# Patient Record
Sex: Male | Born: 1950 | Race: White | Hispanic: No | Marital: Single | Smoking: Current some day smoker
Health system: Southern US, Community
[De-identification: ages and names within clinical notes are randomized; demographics above are authoritative.]

## PROBLEM LIST (undated history)

## (undated) DIAGNOSIS — J449 Chronic obstructive pulmonary disease, unspecified: Secondary | ICD-10-CM

## (undated) DIAGNOSIS — M47812 Spondylosis without myelopathy or radiculopathy, cervical region: Secondary | ICD-10-CM

## (undated) DIAGNOSIS — K2100 Gastro-esophageal reflux disease with esophagitis, without bleeding: Secondary | ICD-10-CM

## (undated) DIAGNOSIS — I10 Essential (primary) hypertension: Secondary | ICD-10-CM

## (undated) DIAGNOSIS — I350 Nonrheumatic aortic (valve) stenosis: Secondary | ICD-10-CM

## (undated) DIAGNOSIS — G894 Chronic pain syndrome: Secondary | ICD-10-CM

## (undated) DIAGNOSIS — F319 Bipolar disorder, unspecified: Secondary | ICD-10-CM

## (undated) DIAGNOSIS — M47816 Spondylosis without myelopathy or radiculopathy, lumbar region: Secondary | ICD-10-CM

## (undated) DIAGNOSIS — R569 Unspecified convulsions: Secondary | ICD-10-CM

## (undated) DIAGNOSIS — Q2381 Bicuspid aortic valve: Secondary | ICD-10-CM

## (undated) DIAGNOSIS — I4891 Unspecified atrial fibrillation: Secondary | ICD-10-CM

## (undated) DIAGNOSIS — K648 Other hemorrhoids: Secondary | ICD-10-CM

## (undated) DIAGNOSIS — J189 Pneumonia, unspecified organism: Secondary | ICD-10-CM

## (undated) DIAGNOSIS — Q231 Congenital insufficiency of aortic valve: Secondary | ICD-10-CM

## (undated) DIAGNOSIS — D649 Anemia, unspecified: Secondary | ICD-10-CM

## (undated) HISTORY — DX: Spondylosis without myelopathy or radiculopathy, cervical region: M47.812

## (undated) HISTORY — DX: Anemia, unspecified: D64.9

## (undated) HISTORY — DX: Gastro-esophageal reflux disease with esophagitis, without bleeding: K21.00

## (undated) HISTORY — PX: KNEE SURGERY: SHX244

## (undated) HISTORY — DX: Congenital insufficiency of aortic valve: Q23.1

## (undated) HISTORY — DX: Chronic obstructive pulmonary disease, unspecified: J44.9

## (undated) HISTORY — DX: Bipolar disorder, unspecified: F31.9

## (undated) HISTORY — DX: Unspecified convulsions: R56.9

## (undated) HISTORY — DX: Other hemorrhoids: K64.8

## (undated) HISTORY — DX: Unspecified atrial fibrillation: I48.91

## (undated) HISTORY — PX: CARPAL TUNNEL RELEASE: SHX101

## (undated) HISTORY — DX: Nonrheumatic aortic (valve) stenosis: I35.0

## (undated) HISTORY — DX: Bicuspid aortic valve: Q23.81

## (undated) HISTORY — DX: Chronic pain syndrome: G89.4

## (undated) HISTORY — DX: Gastro-esophageal reflux disease with esophagitis: K21.0

## (undated) HISTORY — DX: Essential (primary) hypertension: I10

## (undated) HISTORY — DX: Pneumonia, unspecified organism: J18.9

## (undated) HISTORY — DX: Spondylosis without myelopathy or radiculopathy, lumbar region: M47.816

## (undated) HISTORY — PX: NASAL SINUS SURGERY: SHX719

---

## 2004-12-22 ENCOUNTER — Emergency Department (HOSPITAL_COMMUNITY): Admission: EM | Admit: 2004-12-22 | Discharge: 2004-12-22 | Payer: Self-pay | Admitting: Emergency Medicine

## 2005-03-15 ENCOUNTER — Ambulatory Visit: Payer: Self-pay | Admitting: Psychiatry

## 2005-03-16 ENCOUNTER — Ambulatory Visit: Payer: Self-pay | Admitting: Psychiatry

## 2005-04-27 ENCOUNTER — Emergency Department: Payer: Self-pay | Admitting: Emergency Medicine

## 2005-06-27 ENCOUNTER — Emergency Department: Payer: Self-pay | Admitting: Emergency Medicine

## 2005-07-29 ENCOUNTER — Ambulatory Visit: Payer: Self-pay | Admitting: General Practice

## 2005-08-13 ENCOUNTER — Emergency Department: Payer: Self-pay | Admitting: General Practice

## 2005-10-14 ENCOUNTER — Ambulatory Visit: Payer: Self-pay | Admitting: Pain Medicine

## 2005-10-20 ENCOUNTER — Ambulatory Visit: Payer: Self-pay | Admitting: Pain Medicine

## 2005-12-23 ENCOUNTER — Ambulatory Visit: Payer: Self-pay | Admitting: Pain Medicine

## 2006-03-15 ENCOUNTER — Other Ambulatory Visit: Payer: Self-pay

## 2006-03-15 ENCOUNTER — Emergency Department: Payer: Self-pay | Admitting: Emergency Medicine

## 2006-09-18 ENCOUNTER — Emergency Department: Payer: Self-pay | Admitting: Emergency Medicine

## 2006-09-28 ENCOUNTER — Ambulatory Visit: Payer: Self-pay

## 2006-10-17 ENCOUNTER — Emergency Department: Payer: Self-pay | Admitting: Emergency Medicine

## 2006-10-20 ENCOUNTER — Inpatient Hospital Stay: Payer: Self-pay | Admitting: Internal Medicine

## 2006-10-20 ENCOUNTER — Other Ambulatory Visit: Payer: Self-pay

## 2006-12-22 ENCOUNTER — Emergency Department: Payer: Self-pay | Admitting: Emergency Medicine

## 2007-01-29 ENCOUNTER — Inpatient Hospital Stay: Payer: Self-pay | Admitting: Unknown Physician Specialty

## 2007-02-01 ENCOUNTER — Other Ambulatory Visit: Payer: Self-pay

## 2007-02-15 ENCOUNTER — Other Ambulatory Visit: Payer: Self-pay

## 2007-02-15 ENCOUNTER — Emergency Department: Payer: Self-pay | Admitting: Emergency Medicine

## 2007-03-23 ENCOUNTER — Inpatient Hospital Stay: Payer: Self-pay | Admitting: Unknown Physician Specialty

## 2007-06-05 ENCOUNTER — Ambulatory Visit: Payer: Self-pay | Admitting: Neurological Surgery

## 2007-08-11 ENCOUNTER — Other Ambulatory Visit: Payer: Self-pay

## 2007-08-11 ENCOUNTER — Inpatient Hospital Stay: Payer: Self-pay | Admitting: Psychiatry

## 2007-08-24 ENCOUNTER — Encounter: Payer: Self-pay | Admitting: Neurological Surgery

## 2007-09-06 ENCOUNTER — Encounter: Payer: Self-pay | Admitting: Neurological Surgery

## 2008-01-12 ENCOUNTER — Ambulatory Visit: Payer: Self-pay | Admitting: Cardiology

## 2008-01-19 ENCOUNTER — Encounter
Admission: RE | Admit: 2008-01-19 | Discharge: 2008-04-18 | Payer: Self-pay | Admitting: Physical Medicine & Rehabilitation

## 2008-01-22 ENCOUNTER — Ambulatory Visit: Payer: Self-pay | Admitting: Physical Medicine & Rehabilitation

## 2008-01-23 ENCOUNTER — Ambulatory Visit: Payer: Self-pay | Admitting: Cardiology

## 2008-01-24 ENCOUNTER — Ambulatory Visit: Payer: Self-pay

## 2008-01-24 ENCOUNTER — Encounter: Payer: Self-pay | Admitting: Cardiology

## 2008-01-26 ENCOUNTER — Ambulatory Visit: Payer: Self-pay | Admitting: Cardiology

## 2008-01-30 ENCOUNTER — Ambulatory Visit: Payer: Self-pay | Admitting: Cardiology

## 2008-02-07 ENCOUNTER — Ambulatory Visit: Payer: Self-pay

## 2008-02-09 ENCOUNTER — Ambulatory Visit: Payer: Self-pay | Admitting: Cardiology

## 2008-02-10 ENCOUNTER — Encounter: Payer: Self-pay | Admitting: Cardiology

## 2008-02-10 LAB — CONVERTED CEMR LAB
BUN: 12 mg/dL (ref 6–23)
CO2: 25 meq/L (ref 19–32)
Calcium: 9.9 mg/dL (ref 8.4–10.5)
Chloride: 98 meq/L (ref 96–112)
Creatinine, Ser: 0.79 mg/dL (ref 0.40–1.50)
Glucose, Bld: 84 mg/dL (ref 70–99)
HCT: 41.8 % (ref 39.0–52.0)
Hemoglobin: 13.8 g/dL (ref 13.0–17.0)
INR: 1 (ref 0.0–1.5)
MCHC: 33 g/dL (ref 30.0–36.0)
MCV: 89.5 fL (ref 78.0–100.0)
Platelets: 250 10*3/uL (ref 150–400)
Potassium: 4.5 meq/L (ref 3.5–5.3)
Prothrombin Time: 13.2 s (ref 11.6–15.2)
RBC: 4.67 M/uL (ref 4.22–5.81)
RDW: 14.4 % (ref 11.5–15.5)
Sodium: 138 meq/L (ref 135–145)
WBC: 7.8 10*3/uL (ref 4.0–10.5)

## 2008-02-12 ENCOUNTER — Ambulatory Visit: Payer: Self-pay | Admitting: Physical Medicine & Rehabilitation

## 2008-02-14 ENCOUNTER — Ambulatory Visit: Payer: Self-pay | Admitting: Cardiology

## 2008-02-14 DIAGNOSIS — J449 Chronic obstructive pulmonary disease, unspecified: Secondary | ICD-10-CM

## 2008-02-14 DIAGNOSIS — F319 Bipolar disorder, unspecified: Secondary | ICD-10-CM

## 2008-02-14 DIAGNOSIS — E785 Hyperlipidemia, unspecified: Secondary | ICD-10-CM

## 2008-02-14 DIAGNOSIS — J4489 Other specified chronic obstructive pulmonary disease: Secondary | ICD-10-CM | POA: Insufficient documentation

## 2008-02-14 DIAGNOSIS — I6529 Occlusion and stenosis of unspecified carotid artery: Secondary | ICD-10-CM | POA: Insufficient documentation

## 2008-02-14 DIAGNOSIS — I10 Essential (primary) hypertension: Secondary | ICD-10-CM | POA: Insufficient documentation

## 2008-02-14 DIAGNOSIS — I359 Nonrheumatic aortic valve disorder, unspecified: Secondary | ICD-10-CM | POA: Insufficient documentation

## 2008-02-19 ENCOUNTER — Ambulatory Visit: Payer: Self-pay | Admitting: Cardiology

## 2008-03-12 ENCOUNTER — Ambulatory Visit: Payer: Self-pay | Admitting: Physical Medicine & Rehabilitation

## 2008-04-11 ENCOUNTER — Ambulatory Visit: Payer: Self-pay | Admitting: Physical Medicine & Rehabilitation

## 2008-05-08 ENCOUNTER — Encounter
Admission: RE | Admit: 2008-05-08 | Discharge: 2008-07-11 | Payer: Self-pay | Admitting: Physical Medicine & Rehabilitation

## 2008-05-09 ENCOUNTER — Ambulatory Visit: Payer: Self-pay | Admitting: Physical Medicine & Rehabilitation

## 2008-05-13 ENCOUNTER — Encounter: Admission: RE | Admit: 2008-05-13 | Discharge: 2008-05-14 | Payer: Self-pay | Admitting: Anesthesiology

## 2008-05-14 ENCOUNTER — Ambulatory Visit: Payer: Self-pay | Admitting: Anesthesiology

## 2008-06-13 ENCOUNTER — Ambulatory Visit: Payer: Self-pay | Admitting: Physical Medicine & Rehabilitation

## 2008-06-18 ENCOUNTER — Emergency Department: Payer: Self-pay | Admitting: Emergency Medicine

## 2008-07-08 ENCOUNTER — Ambulatory Visit: Payer: Self-pay | Admitting: Physical Medicine & Rehabilitation

## 2008-07-11 ENCOUNTER — Ambulatory Visit: Payer: Self-pay | Admitting: Physical Medicine & Rehabilitation

## 2008-08-07 ENCOUNTER — Encounter
Admission: RE | Admit: 2008-08-07 | Discharge: 2008-08-13 | Payer: Self-pay | Admitting: Physical Medicine & Rehabilitation

## 2008-08-13 ENCOUNTER — Ambulatory Visit: Payer: Self-pay | Admitting: Physical Medicine & Rehabilitation

## 2008-08-14 ENCOUNTER — Encounter: Payer: Self-pay | Admitting: Cardiology

## 2008-08-14 ENCOUNTER — Ambulatory Visit: Payer: Self-pay | Admitting: Cardiology

## 2008-09-03 ENCOUNTER — Encounter: Payer: Self-pay | Admitting: Cardiology

## 2008-09-03 ENCOUNTER — Ambulatory Visit: Payer: Self-pay

## 2008-09-03 ENCOUNTER — Ambulatory Visit: Payer: Self-pay | Admitting: Cardiology

## 2008-09-06 LAB — CONVERTED CEMR LAB
ALT: 29 units/L (ref 0–53)
AST: 34 units/L (ref 0–37)
Albumin: 4.1 g/dL (ref 3.5–5.2)
Alkaline Phosphatase: 44 units/L (ref 39–117)
Bilirubin, Direct: 0 mg/dL (ref 0.0–0.3)
Cholesterol: 136 mg/dL (ref 0–200)
HDL: 36.6 mg/dL — ABNORMAL LOW (ref >39.00)
LDL Cholesterol: 83 mg/dL (ref 0–99)
Total Bilirubin: 0.9 mg/dL (ref 0.3–1.2)
Total CHOL/HDL Ratio: 4
Total Protein: 7.9 g/dL (ref 6.0–8.3)
Triglycerides: 80 mg/dL (ref 0.0–149.0)
VLDL: 16 mg/dL (ref 0.0–40.0)

## 2008-10-14 ENCOUNTER — Ambulatory Visit (HOSPITAL_COMMUNITY): Admission: RE | Admit: 2008-10-14 | Discharge: 2008-10-14 | Payer: Self-pay | Admitting: Orthopedic Surgery

## 2009-02-07 ENCOUNTER — Ambulatory Visit: Payer: Self-pay | Admitting: Cardiology

## 2009-02-10 ENCOUNTER — Telehealth: Payer: Self-pay | Admitting: Cardiology

## 2009-02-14 ENCOUNTER — Ambulatory Visit: Payer: Self-pay | Admitting: Cardiology

## 2009-02-17 LAB — CONVERTED CEMR LAB
ALT: 31 units/L (ref 0–53)
AST: 25 units/L (ref 0–37)
Albumin: 4.6 g/dL (ref 3.5–5.2)
Alkaline Phosphatase: 47 units/L (ref 39–117)
BUN: 12 mg/dL (ref 6–23)
CO2: 28 meq/L (ref 19–32)
Calcium: 9.5 mg/dL (ref 8.4–10.5)
Chloride: 100 meq/L (ref 96–112)
Cholesterol: 125 mg/dL (ref 0–200)
Creatinine, Ser: 0.94 mg/dL (ref 0.40–1.50)
Glucose, Bld: 87 mg/dL (ref 70–99)
HDL: 35 mg/dL — ABNORMAL LOW (ref >39)
LDL Cholesterol: 69 mg/dL (ref 0–99)
Potassium: 5.1 meq/L (ref 3.5–5.3)
Sodium: 139 meq/L (ref 135–145)
Total Bilirubin: 0.3 mg/dL (ref 0.3–1.2)
Total CHOL/HDL Ratio: 3.6
Total Protein: 7.3 g/dL (ref 6.0–8.3)
Triglycerides: 107 mg/dL (ref <150)
VLDL: 21 mg/dL (ref 0–40)

## 2009-02-18 ENCOUNTER — Encounter: Payer: Self-pay | Admitting: Cardiology

## 2009-02-18 ENCOUNTER — Ambulatory Visit: Payer: Self-pay | Admitting: Cardiology

## 2009-04-22 ENCOUNTER — Ambulatory Visit: Payer: Self-pay | Admitting: Cardiovascular Disease

## 2009-04-22 ENCOUNTER — Inpatient Hospital Stay: Payer: Self-pay | Admitting: Internal Medicine

## 2009-05-09 ENCOUNTER — Ambulatory Visit: Payer: Self-pay | Admitting: Cardiovascular Disease

## 2009-05-09 ENCOUNTER — Observation Stay: Payer: Self-pay | Admitting: Internal Medicine

## 2009-06-18 ENCOUNTER — Ambulatory Visit: Payer: Self-pay | Admitting: Surgery

## 2009-07-01 ENCOUNTER — Emergency Department: Payer: Self-pay | Admitting: Emergency Medicine

## 2010-01-03 ENCOUNTER — Emergency Department: Payer: Self-pay | Admitting: Unknown Physician Specialty

## 2010-01-12 ENCOUNTER — Emergency Department: Payer: Self-pay | Admitting: Emergency Medicine

## 2010-01-26 ENCOUNTER — Emergency Department: Payer: Self-pay | Admitting: Emergency Medicine

## 2010-02-28 ENCOUNTER — Inpatient Hospital Stay: Payer: Self-pay | Admitting: Internal Medicine

## 2010-03-04 ENCOUNTER — Ambulatory Visit (HOSPITAL_COMMUNITY): Admission: RE | Admit: 2010-03-04 | Discharge: 2010-03-04 | Payer: Self-pay | Source: Home / Self Care

## 2010-03-29 ENCOUNTER — Encounter: Payer: Self-pay | Admitting: Cardiology

## 2010-04-29 ENCOUNTER — Ambulatory Visit: Payer: Self-pay | Admitting: Emergency Medicine

## 2010-04-30 ENCOUNTER — Emergency Department: Payer: Self-pay | Admitting: Emergency Medicine

## 2010-05-12 ENCOUNTER — Ambulatory Visit: Payer: Self-pay | Admitting: Emergency Medicine

## 2010-06-13 LAB — CBC
HCT: 43.6 % (ref 39.0–52.0)
Hemoglobin: 14.9 g/dL (ref 13.0–17.0)
MCHC: 34.2 g/dL (ref 30.0–36.0)
MCV: 88.8 fL (ref 78.0–100.0)
Platelets: 234 10*3/uL (ref 150–400)
RBC: 4.91 MIL/uL (ref 4.22–5.81)
RDW: 14.9 % (ref 11.5–15.5)
WBC: 6.9 10*3/uL (ref 4.0–10.5)

## 2010-06-13 LAB — BASIC METABOLIC PANEL
BUN: 13 mg/dL (ref 6–23)
CO2: 29 mEq/L (ref 19–32)
Calcium: 9.2 mg/dL (ref 8.4–10.5)
Chloride: 102 mEq/L (ref 96–112)
Creatinine, Ser: 0.82 mg/dL (ref 0.4–1.5)
GFR calc Af Amer: >60 mL/min (ref >60)
GFR calc non Af Amer: >60 mL/min (ref >60)
Glucose, Bld: 103 mg/dL — ABNORMAL HIGH (ref 70–99)
Potassium: 5 mEq/L (ref 3.5–5.1)
Sodium: 136 mEq/L (ref 135–145)

## 2010-07-21 NOTE — Assessment & Plan Note (Signed)
Wayne Guzman returns today for followup of cervicalgia.  He is having  problems sleeping at night due to pain.  He has good pain relief  throughout the day or at least fair with oxycodone 5 mg t.i.d.  He has  responded to a set of medial branch blocks C5-6 level, and is scheduled  for repeat injections with Dr. Stevphen Rochester.  His main complaint today other  than his neck is his right hand pain that goes up into the forearm.  He  has had previous relief with carpal tunnel injections.  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   Examination, he has good grip.  He has no signs of thenar atrophy.  He  has had negative Tinel's.  Phalen's is limited by elbow pain.  He has  limited wrist range of motion on the right side.   IMPRESSION:  1. Cervicalgia, positive response to cervical medial branch block.      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  We will      go ahead and keep him on schedule for May 14, 2008.  2. Right carpal tunnel syndrome.  We will repeat a carpal tunnel      injection.  If his injections only last a short period of time i.e.      less than 3 months, we will consider repeat EMG and surgical      referral.      Erick Colace, M.D.  Electronically Signed     AEK/MedQ  D:  05/09/2008 10:05:43  T:  05/09/2008 20:53:53  Job #:  [id number]

## 2010-07-21 NOTE — Procedures (Signed)
Wayne Guzman, BROUGHTON              ACCOUNT NO.:  [id number]   MEDICAL RECORD NO.:  [id number]          PATIENT TYPE:  REC   LOCATION:  [facility]                          FACILITY:  [facility]   PHYSICIAN:  Erick Colace, M.D.DATE OF BIRTH:  1950/03/17   DATE OF PROCEDURE:  DATE OF DISCHARGE:                               OPERATIVE REPORT   PROCEDURE:  L5-S1 translaminar lumbar epidural steroid injection, right  paramedian approach.   INDICATIONS:  Lumbar radiculitis with moderate-to-severe bilateral  foraminal narrowing L5-S1, bilateral lower extremity radicular pain with  pain interfering with self-care mobility persisting despite medication  management including narcotic analgesic medications.   Informed consent was obtained after describing risks and benefits of the  procedure with the patient.  These include bleeding, bruising,  infection, temporary permanent paralysis.  He elects to proceed and has  given written consent.  The patient placed prone on a fluoroscopy table.  Betadine prep, sterile drape, 25-gauge inch and half needle was used to  anesthetize the skin and subcutaneous tissue, 1% lidocaine x2 mL.  An 18-  gauge, 8-cm Tuohy needle was inserted under fluoroscopic guidance  targeting the inferior lamina of L5 on the right side near midline just  lateral to the spinous process.  Bone contact made, needle was  redirected inferiorly, loss of resistance technique utilized, loss of  resistance obtained while imaging under lateral fluoroscopic views.  Then under AP, Omnipaque 180 x1.5 mL demonstrate good epidural spread  followed by injection of 2 mL of 40 mg/mL Depo-Medrol and 2 mL of 1%  lidocaine.  The patient tolerated the procedure well.  Pre and post  injection vitals stable.  Post injection instructions given.      Erick Colace, M.D.  Electronically Signed     AEK/MEDQ  D:  07/11/2008 14:28:53  T:  07/12/2008 05:03:35  Job:  [id number]

## 2010-07-21 NOTE — Consult Note (Signed)
REQUESTING PHYSICIAN:  Galen Daft. Guffey, MD.   REASON FOR CONSULTATION:  Evaluation of neck pain and back pain.   CHIEF COMPLAINT:  Neck pain greater than back pain greater than right  hand numbness and tingling pain.   HISTORY:  A 60 year old male who has had onset of neck and back pain  dating to at least 2006.  No significant trauma.  He has not been  employed since 2003.  He has comorbidities including bipolar disorder,  aortic stenosis, and COPD.   He has been followed by Dr. Metta Clines at the [facility].  More  recently, he was evaluated by Dr. Murray Hodgkins at [facility].  He saw Dr. Marikay Alar from [facility] and no surgical  intervention was planned.  While in the care of Dr. 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  He had some type of injection, which  according to the notes is likely to be cervical epidural.  I do not have  the actual injection, but this has been referred to.   The patient reportedly has epidural injections per Dr. Metta Clines over at  [facility] as well.   It is not clear why the patient no longer follows at [facility].   Current pain is 8/10, this refers to his neck pain.  Daytime is the  worst.  Pain interferes with sleep.  Pain is worse with walking,  bending, and standing, but he does not have any limitation in his  walking tolerance.  He can exercise on an exercise bike that he brought  too long ago.  He did have some physical therapy in the past and  continues to do some of his exercises at home.  He did buy a TENS unit  and this is partially helpful.  He has never tried traction.   OTHER MEDICATIONS:  Depakote, fentanyl, oxycodone in the past and more  currently he has been on hydrocodone prescribed by his primary care  physician 5 mg one half p.o. q.i.d.   REVIEW OF SYSTEMS:  Negative for bowel or bladder  dysfunction.  He has  no significant radiating pain down his legs.Marland Kitchen   FAMILY HISTORY:  Significant for hypertension, heart disease, and  arthritis.   SOCIAL HISTORY:  He smokes a pack a day.  No alcohol.  Denies any  illicit drugs other than from college.  He had a DUI 10 years ago, but  really has not used alcohol since that time per his report.   PHYSICAL EXAMINATION:  VITAL SIGNS:  Blood pressure 140/78, pulse 70,  respirations 18, and O2 sat 98% on room air.  GENERAL:  Well-developed, well-nourished male, in no acute distress.  Orientation x3.  Affect alert.  Gait is normal.  He is able to heel walk  and toe walk.  EXTREMITIES:  Without edema.  Coordination is normal in the upper and  lower extremities.  Deep tendon reflexes are normal in the upper and  lower extremities.  Sensation is normal in the upper and lower  extremities.  Reverse Phalen's does produce some paresthesias in right  upper extremity.  Motor strength 5/5 in bilateral deltoid, biceps,  triceps, grip as well as hip flexion, knee extension, and ankle  dorsiflexion.  He has normal upper and lower extremity range of motion.  His cervical range of motion is limited to 50% range in forward flexion,  extension,  lateral rotation, and bending.  Lumbar range of motion is  full in forward flexion, but with extension limited to 50% of normal.   Review of radiologic images, was able to pull up C-spine x-ray dated  December 22, 2004, which showed severe spondylosis multilevel  particularly C4-5 and C5-6 levels.  He did have some foraminal stenosis  as well.  No signs of cervical radiculitis.   IMPRESSION:  1. Chronic low back pain, question whether this is facet mediated as      well, although this is a secondary complaint.  2. Right carpal tunnel syndrome.   PLAN:  1. Discussed treatment options.  We went over spine model.  He does      have mainly axial neck pain.  I suspect his degenerative disk in      combination  with facet-mediated pain are likely pain generators.      We will set him up for a medial branch block.  2. In terms of narcotic analgesics, we will need to first check urine      drug screen.  He does seem to recently get by with hydrocodone.  We      will be able to keep him on that rather than getting him back on a      long acting again given that a lot of his pain is activity mediated      and more intermittent.  3. Consider carpal tunnel injection.  4. I will start him on tramadol for now.  He will be able to get by      with this.  5. I have written a prescription for cervical traction.   His Oswestry disability index score today is 68%.   Thank you for this interesting consultation and keep you apprised of his  progress.      Erick Colace, M.D.  Electronically Signed     AEK/MedQ  D:01/22/2008 13:47:57  T:01/23/2008 03:56:51  Job #:  [id number]   cc:   Callie Fielding, M.D.  Fax: [phone]

## 2010-07-21 NOTE — Assessment & Plan Note (Signed)
Mr. Enderle has undergone cervical facet medial branch block, C4-5, C6-7  per Dr. Stevphen Rochester bilaterally.  This gave him a good 2-week relief.  This  is the second time, he has had similar degree of relief from injection  at the same level.  He also complains of back pain and this in fact is  his major complaint at the current time.  His neck is still hanging in  there with the last injection.   Continue using pain medications and has had a flare-up of his back pain,  wondering if he could go up a bit on his oxycodone just for a month or  so.   I have reviewed his prior MRI reports.  He does have evidence of  degenerative disk, particularly at L5-S1 and moderate-to-severe  bilateral foraminal narrowing.   Current pain meds oxycodone 5 mg q.i.d.   Current pain level 7/10, back greater than neck.  He walks 2 minutes of  time and does not climb steps.   REVIEW OF SYSTEMS:  Positive for depression and anxiety.  He sees Dr.  Stevphen Rochester Su who is his psychiatrist.   PAST MEDICAL HISTORY:  Hypertension.   SOCIAL HISTORY:  Single, had a DUI 10 years ago.  Denies any current  alcohol use.   FAMILY HISTORY:  Heart disease, diabetes, and high blood pressure.   MEDICATIONS:  Effexor, Zoloft, clonazepam, Depakote, Zyprexa, and Zocor.   PHYSICAL EXAMINATION:  VITAL SIGNS:  Blood pressure 106/74, pulse 88,  respirations 18, O2 sat 97% on room air.  GENERAL:  Well-nourished and well-nourished male in no acute distress.  NECK:  Range of motion is 50%, forward flexion, extension, lateral  rotation, and bending.  His pain is mainly with sideward bending pain  over the lateral masses.  Motor strength is full bilateral upper and  lower extremities.  He does have some pain going down into his left  lower extremity, exacerbated by straight leg raising test.   Oswestry score is 50% today.   IMPRESSION:  Cervicalgia with improvement x2 with cervical medial branch  blocks.  He will benefit from cervical  facet radiofrequency neurotomy;  however, we would like to first address his low back as this is  bothering him more than his neck at the current time.  I reviewed his  scans and reviewed his exam and history and we will schedule for L5-S1  translaminar lumbar epidural steroid injection and failing this,  consider other treatment plans such as SI injection versus medial branch  blocks.   PLAN:  To do a L5-S1 injection translaminar.      Erick Colace, M.D.  Electronically Signed    AEK/MedQ  D:  06/13/2008 15:21:06  T:  06/14/2008 01:45:44  Job #:  [id number]

## 2010-07-21 NOTE — Assessment & Plan Note (Signed)
Wayne Guzman OFFICE NOTE   NAME:Wayne Guzman, Wayne Guzman                       MRN:          [id number]  DATE:01/12/2008                            DOB:          [birth year]    PRIMARY CARE PHYSICIAN:  Dr. Gelene Mink.   HISTORY OF PRESENT ILLNESS:  This is a 60 year old with a history of  bipolar disorder, COPD, active smoking, and hypertension, who presents  for a second opinion on aortic stenosis.  The patient has had an  echocardiogram done in December 2008 showing what sounds like mild  aortic stenosis with an aortic valve area of 1.71 cm.  He was re-echoed  about 6 months later at another facility.  We do not have the result  from that echo yet, though he states that the doctor told him that he  would need his valve replaced within 5 years.  At baseline, the patient  smokes about half pack a day.  He has noted over the past year that he  has had increasing shortness of breath on exertion.  He states that at  this point, he is short of breath after climbing 1 flight of steps or  walking 2-3 blocks on flat ground.  Additionally, he does have episodes  that he describes as panic attacks, and these happen 2-3 times a month.  There is no particular trigger.  They are associated with extreme  feelings of anxiety along with central chest tightness.  These last  anywhere from 10 minutes to an hour and they are not associated with  exertion in particular.  He has been to the hospital 3 times with these  episodes.  Each time, he has been ruled out for an MI with negative  cardiac enzymes.  He states that since he has been on a better regimen  of psychiatric medications for his bipolar disorder, these panic attacks  have been much less frequent.  Additionally, the patient did have a  stress test that he thinks occurred about 6 months ago at Dr. Jari Sportsman  office.  He was not told the results and we do not have the results  available today.   PAST MEDICAL HISTORY:  1. Bipolar disorder.  2. COPD.  3. The patient is an active smoker about a half pack a day.  He has      been smoking for 25-30 years.  Dr. Timoteo Gaul would like to avoid use      of Chantix on this patient because of risk of depression from that      medication.  4. Hypertension.  5. Chronic pain disorder.  The patient has a history of cervical and      lumbar pain from degenerative joint disease.  He is disabled and      [facility]  6. Aortic stenosis.  Most recent echo report that is available to me      is from December 2008, it shows by report normal LV systolic      function and mild aortic stenosis with an aortic valve area of  1.71      sq cm.  The study is described as technically limited.  7. Carotid ultrasound done in December 2008 showed left internal      carotid artery stenosis, 50-69%.   Most recent labs are from August 2009 that show a hematocrit of 41.7,  platelets 249; potassium 4.7, creatinine 0.96; LDL 95, HDL 39, and  triglycerides 54; TSH is normal.  EKG today, normal sinus rhythm.  It is  a normal EKG with no LVH.   MEDICATIONS:  1. Depakote 500 mg t.i.d.  2. Advair 250/50 daily.  3. Amlodipine/benazepril, uncertain of the dose.  The patient did not      bring his pill bottle on today.  4. Hydrocodone.  5. Zoloft 50 mg daily.  6. Zyprexa 10 mg nightly.  7. Klonopin.   SOCIAL HISTORY:  The patient is disabled due to degenerative joint  disease of the spine.  He smokes half pack a day.  He smoked more  heavily in the past.  He is interested in quitting.  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  He does  not use any illicit drugs.   FAMILY HISTORY:  The patient's father developed coronary artery disease  in his early 50s.  No other history of premature coronary artery disease  in the family.  No history that he knows of aortic stenosis.   REVIEW OF SYSTEMS:   Negative except as noted in the history of present  illness.   PHYSICAL EXAMINATION:  VITAL SIGNS:  Blood pressure 106/66, heart rate  is 84 and regular.  Weight is 168 pounds.  GENERAL:  This is a well-developed male in no apparent distress.  NEUROLOGIC:  Alert and oriented x3.  Normal affect.  HEENT:  Normal.  NECK:  No JVD.  There are normal carotid upstrokes.  No thyromegaly or  thyroid nodule.  LUNGS:  Slight rhonchi bilaterally.  Otherwise clear.  CARDIOVASCULAR:  Heart regular.  S1 and S2.  No S3.  No S4.  There is a  2/6 crescendo-decrescendo, mid-peaking systolic murmur heard best at the  right upper sternal border.  The murmur radiates into the neck, although  the murmur in the neck is more prominent on the left, which suggests  that there maybe a component of carotid stenosis from that.  S2 is heard  clearly.  Carotid upstrokes are normal.  There are 2+ posterior tibial  pulses bilaterally.  There is no peripheral edema.  ABDOMEN:  Soft and nontender.  No hepatosplenomegaly.  EXTREMITIES:  No clubbing or cyanosis.  SKIN:  Normal.  MUSCULOSKELETAL:  Normal.   ASSESSMENT AND PLAN:  This is a 60 year old with a history of chronic  obstructive pulmonary disease and bipolar disorder, who presents for  evaluation of aortic stenosis.  1. Aortic stenosis.  The patient does have significant New York Heart      Association Class III-type symptoms.  However, I am not sure if      these symptoms are due primarily to his COPD or if there is a      component of symptomatology from the heart.  On exam, the patient's      aortic stenosis to me seems at most moderate.  I do not hear exam      evidence for severe aortic stenosis.  In addition, the patient does      not appear volume overloaded on exam, and he does not  have left      ventricular hypertrophy on his electrocardiogram.  Our plan will be      to repeat an echocardiogram here.  I will take a close look at it      to see the  degree of his aortic stenosis, though I predict that it      will probably be mild-to-moderate.  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  Hopefully,      we will be able to see the bicuspid valve on transthoracic      echocardiogram.  If we are not able to see the valve clearly on      transthoracic echocardiogram, I probably would recommend that he      get a TEE done.  We will look at the TEE to see his valve of      bicuspid and if the valve is bicuspid, we will also need to check      and see if his thoracic aorta is dilated as this is a risk in the      setting of bicuspid aortic valve.  If we are able to see a bicuspid      aortic valve on the transthoracic echocardiogram, then I will      probably obtain a CTA or MRA of his chest to assess for aortic      dilatation, as this is a significant risk in the setting of      bicuspid valve.  2. Coronary artery disease risk.  The patient does have episodes of      atypical chest pain that he describes as panic attacks.  These may      in deed be due to his psychiatric disease.  However, I do think      given his risk factors that he needs to have had an evaluation for      coronary artery disease.  It appears he had a stress test about 6      months ago at Dr. Jari Sportsman office.  We will try to get a report of      that stress test from either Dr. Timoteo Gaul or Dr. Kirke Corin.  We will have      him start aspirin 81 mg a day.  His LDL is below 100.  I do not      really have any evidence for starting a statin drug, though there      is some suggestion that a low LDL can help prevent the progression      of aortic stenosis.  We should aim to keep his LDL below 100.  3. Hypertension.  The patient's blood pressure is well controlled on      his new medication, which is amlodipine/benazepril.  4. Chronic obstructive pulmonary disease.  The  patient does carry a      history of chronic obstructive pulmonary disease.  He does have      significant dyspnea on exertion.  I do wonder if his symptoms are      more due to his chronic obstructive pulmonary disease than to his      heart disease.  Therefore, I am going to get a set of PFTs checked      on him to see how severe his chronic obstructive pulmonary disease  is.  He will continue using his inhalers.  5. Carotid artery stenosis.  The patient does have a symptomatic 50-      69% carotid artery stenosis found on ultrasound in December 2008.      We will go ahead and repeat carotid ultrasound in December 2009,      and we can set that up in our office.  6. I talked to the patient about smoking cessation and gave him a      prescription for nicotine patches.  7. I will see the patient back in 2-3 weeks after we done the PFTs and      the echo for further evaluation.     [name], MD  Electronically Signed    DM/MedQ  DD: 01/12/2008  DT: 01/12/2008  Job #: [id number]   cc:   Dr. Gelene Mink

## 2010-07-21 NOTE — Assessment & Plan Note (Signed)
REASON FOR VISIT:  Neck pain greater than back pain.   HISTORY:  A 60 year old male who I last saw on March 12, 2008.  He  underwent his carpal tunnel injection for right hand pain and this was  after a positive EMG showing a carpal tunnel.  He had good results with  this.  He also had cervical injections done by myself on February 12, 2008, which resulted improvement in range of motion as well as pain,  although the pain relief was not immediate.  The patient did not have  any other new medical problems since I last saw him.  He was to get a  repeat set of medial branch blocks, however, his ride was late and  therefore was late for appointment, we just saw him for recheck today.   He also has back pain but this is not as bad as his neck pain and is  considering injections for that.   Examination of his neck has 75% range forward flexion, extension,  lateral rotation, and bending.  Extension seems to make his pain worse.  He has mild tenderness to palpation in the lateral aspect of the  cervical spine area.  He has full strength in the upper extremities,  normal range of motion in the upper extremities, as well as deep tendon  reflexes.   Pain level is 6/10.   Pain interference score is 5/10.  He is independent with the all self-  care.   Review of systems is positive for tingling, spasms, depression, and  anxiety.   Examination, blood pressure 128/66, pulse 85, respirations 18, O2 sat  97% on room air.  In general, no acute stress.   IMPRESSION:  Cervicalgia with positive response to one set of cervical  medial branch blocks.  Based on his response, we would recommend repeat.  His last injection was at C5-C6 level.  We will get him in first  available with Dr. Stevphen Rochester for next set to expedite.   I will see the patient back in about 1 month for a recheck.  Consider  further imaging for lumbar spine.      Erick Colace, M.D.  Electronically Signed     AEK/MedQ  D:  04/11/2008 11:25:23  T:  04/12/2008 00:19:02  Job #:  [id number]

## 2010-07-21 NOTE — Assessment & Plan Note (Signed)
[facility] OFFICE NOTE   NAME:Senske, RENWICK                       MRN:          [id number]  DATE:02/09/2008                            DOB:          Sep 23, 1950    PRIMARY CARE PHYSICIAN:  Dr. Gelene Mink.   HISTORY OF PRESENT ILLNESS:  This is a 60 year old with a history of  bipolar disorder, COPD, smoking, hypertension who now presents for  reevaluation of the aortic stenosis.  On his initial transthoracic  echocardiogram done here in the office, he had a mean gradient of 42 mm  of mercury across the aortic valve with dimensionless index of 0.24  suggesting severe aortic stenosis.  We were unable to see the actual  aortic valve itself very well and there was a question that it might be  bicuspid.  We did do a TEE to confirm this and we did find a bicuspid  aortic valve.  This is often associated with aortic aneurysm and with  coarctation of the aorta; however, the patient did not have any aneurysm  of his thoracic aorta or coarctation of his aorta.  By TEE, the  planimetered valve area was 1.1 cm squared with a mean gradient of 32.  However, I do wonder if this gradient may be a bit off as we were not  perfectly lined up with the valve.  We also did PFTs showing moderate  COPD.  The patient did start on Spiriva.  He states his Spiriva is  helping his breathing some, though he is continuing to get quite short  of breath with any moderate exertion.  He becomes short of breath after  walking about a block and a half at a moderate pace and he also gets  short of breath climbing a flight of steps.   PAST MEDICAL HISTORY:  1. Bipolar disorder.  2. Chronic obstructive pulmonary disease.  PFTs done in November 2009,      showed an FVC of 108%, FEV1 of 64%, and FEF 25-75% of 30%.  There      was 23% improvement in the FEV1 with a bronchodilator.  This      suggest moderate obstructive defect.  The patient of note  does      continue to smoke.  He is trying to cut this down and is smoking 4      cigarettes a day.  3. Hypertension.  4. Chronic pain disorder.  The patient has a history of cervical and      lumbar pain vertebral from degenerative joint disease.  He has been      stable and has follow up with Pain Clinic.  5. Aortic stenosis.  Echocardiogram on January 24, 2008, showed an EF      of 55-60% with no regional wall motion abnormalities.  Aortic      stenosis appeared severe with a mean gradient of 42 mm of mercury      and a dimensionless index to 0.24.  The aortic valve itself was not      well visualized.  There was a  question of that being bicuspid.  We      did do a TEE also in November 2009, showing that it is indeed a      bicuspid aortic valve.  The planimetered valve area was 1.1 cm      squared with a mean gradient 32 mm mercury, but I wonder how      accurate this is as I am not sure if this was perfectly lined up      with the valve.  There was no aortic coarctation.  There was no      aneurysm of the thoracic aorta.  6. Carotid ultrasound done in December 2008, showed left internal      carotid artery stenosis with 30-69%.  7. Nuclear stress test done in December 2008, and that was found to      show no ischemia or infarction.   Most recent labs August 2009, creatinine 0.96, LDL 95, HDL 39,  triglycerides 54.  TSH is normal.   MEDICATIONS:  1. Amlodipine/benazepril.  The patient is unsure of the dose he is on.      He did not bring his pill bottle today.  2. Zyprexa 10 mg daily.  3. Advair 250/50 b.i.d.  4. Zoloft 10 mg daily.  5. Klonopin.  6. Aspirin 81 mg daily.  7. Depakote 1500 mg daily.  8. Spiriva inhaled daily.   SOCIAL HISTORY:  The patient is trying to cut off smoking altogether he  is down to 2-3 cigarettes a day.  He is trying to cut back.  He is not  yet started using a nicotine patch.   PHYSICAL EXAMINATION:  VITAL SIGNS:  Blood pressure today 100/70,  weight  is 166 pounds, and heart rate is 78 and regular.  GENERAL:  This is a well-developed male, in no apparent distress.  NEUROLOGIC:  Alert and oriented x3.  Normal affect.  HEENT:  Normal.  NECK:  No JVD.  The carotid upstrokes actually do appeared fairly  normal.  There is no thyromegaly or thyroid nodules.  LUNGS:  Slight rhonchi bilaterally; otherwise clear.  CARDIOVASCULAR:  Heart, regular S1 and S2.  No S3.  There is an S4  present.  There is a 2/6 crescendo-decrescendo mid peaking systolic  murmur heard best at the right external border.  The murmur does radiate  into the neck.  S2 was actually heard fairly clearly.  There is no  peripheral edema.  EXTREMITIES:  2+ posterior tibial pulses bilaterally.   ASSESSMENT AND PLAN:  This is a 60 year old with a history of chronic  obstructive pulmonary disease, bipolar disorder who now presents for  evaluation of the aortic stenosis.  1. Aortic stenosis.  The patient does have bicuspid aortic valve.  The      valve was not seen very well on transthoracic echocardiogram;      however, we did get a mean gradient of 42 mm of mercury across the      valve, suggesting severe AS.  TEE showed a planimetered valve area      of 1.1 cm squared with a mean gradient of 32 mm of mercury which      would be more moderate; however, I am not sure if the doppler      interrogation was lined up well with the valve on TEE which could      underestimate the mean gradient.  Therefore, the patient has a      transthoracic echo suggesting severe AS  and a TEE suggesting that      the AS is more towards the moderate range.  The patient does have      significant New York Heart Association Class III symptoms; however,      he also has moderate COPD, so this is probably causing at least      some of the symptoms.  Additionally, his carotid upstrokes did not      appear significantly weak to me and his exam is consistent with      more moderate aortic  stenosis.  We will plan on setting him up for      a hemodynamic heart catheterization.  We will attempt to cross his      aortic valve and try to measure his pressures directly as I do not      want to commit him at this time to surgery unless he does indeed      have severe aortic stenosis.  We will take him next week for right      and left heart catheterization and we will also attempt to cross      the aortic valve.  2. Chronic obstructive pulmonary disease.  The patient has started on      Spiriva as well as Advair.  He does feel the Spiriva does help      some.  3. Hypertension.  Blood pressures well controlled on amlodipine and      benazepril.  4. Carotid artery stenosis.  The patient had a followup carotid      ultrasound done yesterday.  We are awaiting the results of that.  5. Hyperlipidemia.  The patient's LDL is less than 100 which should be      his goal currently.  6. Smoking.  I did talk to the patient again, that he needs to quit      smoking altogether and he needs to do it as soon as possible.     [name], MD  Electronically Signed    DM/MedQ  DD: 02/09/2008  DT: 02/10/2008  Job #: [id number]   cc:   Gelene Mink

## 2010-07-21 NOTE — Procedures (Signed)
Wayne Guzman, Wayne Guzman              ACCOUNT NO.:  [id number]   MEDICAL RECORD NO.:  [id number]           PATIENT TYPE:   LOCATION:                                 FACILITY:   PHYSICIAN:  Erick Colace, M.D.DATE OF BIRTH:  12-26-1950   DATE OF PROCEDURE:  03/12/2008  DATE OF DISCHARGE:                               OPERATIVE REPORT   PROCEDURE:  Right carpal tunnel injection.   INDICATION:  Right carpal tunnel syndrome.  He had an EMG/NCV by Dr.  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   Informed consent was obtained after describing risks and benefits of the  procedure to the patient including bleeding, bruising, infection.  He  elects to proceed and has given written consent.  The patient placed in  seated position area on the distal wrist crease marked, prepped with  Betadine in between the FCR and palmaris longus tendons.  The skin wheal  raised with 0.25 mL of 1% lidocaine followed by insertion of 27-gauge  5/8th inch needle into the carpal tunnel and 0.25 mL of 40 mg/mL of Depo-  Medrol was injected after negative draw back for blood.  The patient  tolerated the procedure well.  Post injection instructions given.      Erick Colace, M.D.  Electronically Signed     AEK/MEDQ  D:  03/12/2008 16:44:48  T:  03/13/2008 05:09:31  Job:  [id number]

## 2010-07-21 NOTE — Assessment & Plan Note (Signed)
[facility] OFFICE NOTE   NAME:Wayne Guzman, Wayne Guzman                       MRN:          [id number]  DATE:01/26/2008                            DOB:          12/10/1950    PRIMARY CARE PHYSICIAN:  Dr. Gelene Mink.   HISTORY OF PRESENT ILLNESS:  This is a 60 year old with a history of  bipolar disorder, COPD, smoking and hypertension, who presented for  evaluation of aortic stenosis.  Since his last appointment, we did do an  echocardiogram here in the office and his aortic valve is difficult to  visualize.  However, he did have an mean gradient of 42 mmHg across the  aortic valve with a dimensionless index of 0.24 suggesting severe aortic  stenosis.  The patient does continue to have quite significant dyspnea  on exertion.  He gets short of breath after walking about a block and  half at a moderate pace.  He states he gets short of breath with his  heart pounding and gets sweaty and has to stop.  Additionally, he gets  significantly short of breath if he tries to climb a flight of steps at  a normal pace.  He states that over the last 3 years, he has had quite a  severe decrease in exercise tolerance.  Prior to that time, he was able  to jog, play tennis and goes skiing, and he cannot do any of this  anymore.  He does additionally have chronic obstructive pulmonary  disease confirmed on PFTs that we did recently.  He does deny any  episodes of wheezing.   PAST MEDICAL HISTORY:  1. Bipolar disorder.  2. COPD.  PFTs done in November 2009 showed an FVC of 108%; FEV-1 of      64%; and FEF 25-75% of 30%.  There was 23% improvement in the FEV-1      with a bronchodilator.  This suggest moderate obstructive deficit      and the patient does continue to smoke.  He is trying to cut back.      He is down to 4 cigarettes a day.  3. Hypertension.  4. Chronic pain disorder.  The patient has a history of cervical and      lumbar  pain from degenerative joint disease.  He is disabled and      followed at the pain clinic.  5. Aortic stenosis.  The most recent echo was done on January 24, 2008, showed an EF of 55-60%.  There are no regional wall motion      abnormalities.  There is a suggestion of severe aortic stenosis      with a mean gradient of 42 mmHg and dimensionless index of 0.24.      The aortic valve itself was actually not very well visualized  6. Carotid ultrasound done on December 2008 showed left internal      carotid artery stenosis at 50-69%.  7. Nuclear stress test done on December 2008 at an outside site was      found to  show no ischemia or infarction.   Most recent labs from August 2009 showed creatinine 0.96, LDL 95, HDL  39, triglycerides 54, and TSH is normal.   MEDICATIONS:  1. Amlodipine/benazepril, the patient is actually unsure what dose he      is on and did not bring his pill bottle today.  2. Zyprexa 10 mg nightly.  3. Advair 250/50 b.i.d.  4. Zoloft 10 mg daily.  5. Klonopin.  6. Aspirin 81 mg daily.   SOCIAL HISTORY:  The patient continues to smoke about 4 cigarettes a  day.  He is cutting back.  He is interested in quitting.  He has not  started using the nicotine patch that I gave him last time when he was  here.   PHYSICAL EXAMINATION:  VITAL SIGNS:  Blood pressure today is 130/82,  weight is 163 pounds, and heart rate is 80 and regular.  GENERAL:  This is a well-developed male in no apparent distress.  NEUROLOGIC:  Alert and oriented x3.  Normal affect.  HEENT:  Normal exam.  NECK:  No JVD.  The carotid upstrokes actually do appear fairly normal.  There is no thyromegaly or thyroid nodules.  LUNGS:  There is slight rhonchi bilaterally, otherwise clear.  CARDIOVASCULAR:  Heart, regular S1 and S2.  No S3.  There is an S4.  There is a 2/6 crescendo-decrescendo mid peaking systolic murmur heard  best in the right upper sternal border.  The murmur does radiate into   the neck.  S2 is actually heard clearly.  EXTREMITIES:  There are 2+ posterior tibial pulses bilaterally.  There  is no peripheral edema.  ABDOMEN:  Soft and nontender.  No hepatosplenomegaly.   ASSESSMENT AND PLAN:  This is a 60 year old with a history of chronic  obstructive pulmonary disease and bipolar disorder who presents for  evaluation of his aortic stenosis.  1. Aortic stenosis.  The patient had an echocardiogram done on      January 24, 2008, that was limited by poor acoustic windows.  We      were not able to see the valve itself very well.  However, the      patient does have a mean gradient of 42 mmHg across the valve with      a dimensionless index of 0.24.  This suggests severe aortic      stenosis.  The patient also does have New York Heart Association      class III symptoms of shortness of breath with exertion.  However,      this picture is complicated by his documented moderate chronic      obstructive pulmonary disease that could also be causing symptoms.      It does seem like he has had a quite marked decrease in exercise      tolerance over the last several years that has been quite steadily      progressive.  Interestingly, on my exam, the aortic stenosis does      not seem severe, it seems more moderate.  I do think we do need to      do further workup of this aortic stenosis.  I think at this time we      will do transesophageal echocardiography to get a better look at      the valve itself and especially to see if the valve is bicuspid.      If the valve is bicuspid, we would also worry about a thoracic  aortic aneurysm and we will check for this on transesophageal      echocardiography as well.  We will additionally remeasure his      gradient and planimetry of the valve on transesophageal      echocardiography.  He will likely need a cardiac catheterization      after TEE to assess for coronary disease prior to valve surgery if      severe AS has been  confirmed or to try to cross the valve for      definitive assessment of the valve gradient if the TEE suggests      less than severe AS.  If we do confirm severe aortic stenosis, I do      think the patient should have his valve replaced as I do think his      symptoms are somewhat out of proportion to his lung disease.  We      will refer him to one of our cardiac surgeons at [facility] in that      situation.  2. Hypercholesterolemia.  The patient's LDL is less than a 100, which      is what would be his goal currently  3. Hypertension.  The patient's blood pressure is well controlled on      the amlodipine/benazepril that he is taking  4. Chronic obstructive pulmonary disease.  Pulmonary function tests do      confirm moderate chronic obstructive pulmonary disease.  The      patient is on Advair.  He does have a bronchodilator response on      his Pulmonary function tests.  I am going to actually add Spiriva      to his regimen and additionally I talked to him at length about the      need to quit smoking, especially with the possibility of him going      for cardiac surgery in the near future.  He will put on his      nicotine patches.  5. Carotid artery stenosis.  In December, we will be doing a followup      carotid ultrasound.     [name], MD  Electronically Signed    DM/MedQ  DD: 01/26/2008  DT: 01/27/2008  Job #: [id number]   cc:   Gelene Mink

## 2010-07-21 NOTE — Op Note (Signed)
Wayne Guzman, Wayne Guzman              ACCOUNT NO.:  [id number]   MEDICAL RECORD NO.:  [id number]          PATIENT TYPE:  AMB   LOCATION:  SDS                          FACILITY:  [facility]   PHYSICIAN:  Feliberto Gottron. Turner Daniels, M.D.   DATE OF BIRTH:  Aug 06, 1950   DATE OF PROCEDURE:  10/14/2008  DATE OF DISCHARGE:  10/14/2008                               OPERATIVE REPORT   PREOPERATIVE DIAGNOSIS:  Right carpal tunnel syndrome.   POSTOPERATIVE DIAGNOSIS:  Right carpal tunnel syndrome.   PROCEDURE:  Right carpal tunnel release.   SURGEON:  Feliberto Gottron. Turner Daniels, MD   FIRST ASSISTANT:  Shirl Harris, PA-C   ANESTHETIC:  Local IV sedation.   ESTIMATED BLOOD LOSS:  Minimal.   FLUID REPLACEMENT:  500 mL of crystalloid.   DRAINS PLACED:  None.   TOURNIQUET TIME:  About 15 minutes.   INDICATIONS FOR PROCEDURE:  A 60 year old man with moderate to severe  right carpal tunnel syndrome, failed conservative measures, desires  elective right carpal tunnel release.  Risks and benefits of surgery  have been discussed.  Questions are answered.  Surgery is being done at  the [facility] because of the history of aortic stenosis.   DESCRIPTION OF PROCEDURE:  The patient was identified by armband and was  taken to operating room #1 at [facility] where the appropriate  anesthetic monitors were attached and IV sedation was administered.  Prior to the prep, we went ahead and administered a local block using a  50:50 mixture of 0.5% Marcaine and 2% Xylocaine a total of about 8-9 mL,  placing the block transversely at the risk and going up to the thenar  crease and the subcutaneous tissue.  Once the block had been placed, we  went ahead and went to the scrub sink and did normal prep and the right  upper extremity was prepped and draped in the usual sterile fashion from  the fingertips to a tourniquet at the mid right forearm.  At this point,  the limb was wrapped with an Esmarch bandage.  Tourniquet was  Inflated  to 250 mmHg.  Standard time-out procedure was performed.  We began the  operation itself by making a longitudinal incision starting at the wrist  flexion crease and going distally through the skin and subcutaneous  tissue down to the level of the carpal tunnel distally.  A small  incision was made in the palmar fascia in line with placement of a Market researcher into the carpal tunnel.  The Glorious Peach was kept on the palmar side  of the tunnel.  We cut down the [place] performing the release.  The  release was extended up into the forearm fascia with the tenotomy  scissors for a distance of about 2 cm under direct vision.  We then  palpated the carpal tunnel to make sure that there were no masses or  ganglia or any fibrous tissue on the top of the median nerve.  The wound  was irrigated out with  normal saline solution and then the skin only was closed with running 4-  0 nylon  suture.  A dressing of Xeroform, 4 x 4 dressing sponges, 2-inch  Webril, and 2-inch Ace wrap was applied.  The tourniquet was let down,  the hand pinked up.  The patient was then taken to the recovery room  without difficulty.      Feliberto Gottron. Turner Daniels, M.D.  Electronically Signed     FJR/MEDQ  D:  10/14/2008  T:  10/15/2008  Job:  [id number]

## 2010-07-21 NOTE — Assessment & Plan Note (Signed)
[facility] OFFICE NOTE   NAME:Carne, KUSH                       MRN:          [id number]  DATE:02/19/2008                            DOB:          April 10, 1950    PRIMARY CARE PHYSICIAN:  Galen Daft. Guffey, MD.   HISTORY OF PRESENT ILLNESS:  This is a 60 year old with the history  bipolar disorder, COPD, smoking, and hypertension who presented  initially for evaluation of his aortic stenosis.  He returns to the  office today because of groin pain at the site of his heart  catheterization.  The patient had an initial transthoracic  echocardiogram done in our office that suggested severe aortic stenosis.  TEE was done to follow up the possibility of a bicuspid aortic valve and  to rule out aortic aneurysm.  The TEE did show a bicuspid aortic valve,  but by TEE, the aortic stenosis appeared to be more moderate.  Given the  discordant findings on these tests and the fact that the aortic stenosis  sounded moderate on exam, we did do a left and right heart  catheterization on February 14, 2008, which showed no angiographic  coronary artery disease and normal right and left heart filling  pressures and moderate aortic stenosis.  After discharge from  catheterization, the patient states that he initially did well.  However, he thinks he has noted about a quarter-sized swelling in his  right groin and states that the swelling is painful and it hurts when he  walks.  He has tried Tylenol and Ultram without pain relief.  He returns  today for evaluation of his right groin.   PAST MEDICAL HISTORY:  1. Bipolar disorder.  2. COPD.  PFTs done in October 2009 showed an FVC of 108%, FEV-1 of      64%, FEF 25-75 of 30%.  There was 23% improvement in the FEV-1 of      the bronchodilator.  This suggests a moderate obstructive defect.      The patient now does continue to smoke.  He is trying to cut this      down and smoking 4  cigarettes a day.  3. Hypertension.  4. Chronic pain disorder.  The patient has a history of cervical      lumbar vertebral pain for degenerative joint disease.  He has been      stable and has followed [facility].  5. Aortic stenosis.  Echo on January 24, 2008, showed an EF of 55-60%      with no regional wall motion abnormalities.  Aortic stenosis      appears severe with mean gradient of 42 mmHg and dimensionless      index of 0.24.  The aortic valve itself was not well visualized.      There was a question of it being bicuspid, therefore we did TEE      also in November 2009 showing that it is indeed a bicuspid aortic      valve.  The planimetered valve area was 1.1 cm2 with mean gradient  of 32 mmHg.  There was no aortic coarctation.  There was no      aneurysm of the thoracic aorta.  Given these discordant findings,      left and right heart catheterization were done on February 14, 2008.      Filling pressures were as follows:  Mean right atrial pressure 6      mmHg, RV 27/9, PA 23/9, pulmonary capillary wedge pressure mean 9      mmHg, LVEDP 13 mmHg.  Mean aortic valve gradient was 31 mmHg.  This      was done with a JR-4 catheter in the left ventricle and from the      side arm of a long sheath that was in the lower aorta.  The      pullback gradient using a pigtail catheter was actually even lower.      Therefore, it appears based on the studies done to date and the      exam that there is moderate aortic stenosis involving a bicuspid      aortic valve.  6. Carotid ultrasound done in December 2008 showed left internal      carotid artery 30-69% stenosis.  7. Left heart catheterization done in December 2009 showed no      angiographic coronary artery disease.   MEDICATIONS:  1. Amlodipine/benazepril 5/20 mg daily.  2. Zyprexa 10 mg daily.  3. Advair 250/50 b.i.d.  4. Zoloft 10 mg daily.  5. Klonopin.  6. Aspirin 81 mg daily.  7. Depakote 1500 mg daily.  8. Spiriva  daily.  9. Zocor 20 mg daily.   SOCIAL HISTORY:  The patient has been trying to cut off smoking  altogether.  He is down to 2-3 cigarettes a day.  He is trying to cut  back.  He has not yet started to use nicotine patch.   PHYSICAL EXAMINATION:  VITAL SIGNS:  Blood pressure today is 142/72,  weight is 168 pounds, heart rate is 84 and regular.  GENERAL:  He is a well-developed male, in apparent distress.  NEUROLOGICAL:  Alert and oriented x3.  Normal affect.  NECK:  No JVD.  Carotid upstrokes appeared fairly normal.  There is no  thyromegaly or thyroid nodule.  LUNGS:  Slight rhonchi bilaterally, otherwise clear with normal  respiratory effort.  CARDIOVASCULAR:  Heart regular S1 and S2.  No S3.  There is an S4  present.  There is a 2/6 crescendo-decrescendo mid peaking systolic  murmur heard best at the right upper sternal border.  The murmur does  radiate into the neck.  S2 is heard fairly clearly.  EXTREMITIES:  There is no peripheral edema, 2+ posterior tibial pulses  bilaterally, and no peripheral edema.  ABDOMEN:  Right groin, there is a quarter-sized hematoma.  There is no  bruit.  There are 2+ femoral, posterior tibial, and dorsalis pedis  pulses on the right.   ASSESSMENT AND PLAN:  This 60 year old with the history of chronic  obstructive pulmonary disease and bipolar disorder who presents for  evaluation of his aortic stenosis.  1. Aortic stenosis.  The patient does have bicuspid aortic valve.      There is no aortic coarctation or thoracic aortic aneurysm.  On      taking into account all the data from the transthoracic      echocardiogram, TEE, and right and left heart catheterization, it      does appear that the patient probably has moderate aortic stenosis.  As his exam is consistent with moderate aortic stenosis, I think      that a lot of his shortness of breath may be related to his chronic      obstructive pulmonary disease.  He has had considerable  improvement      in his dyspnea on exertion with Spiriva.  My plan for now will be      to have him back in 6 months and he will get a transthoracic      echocardiogram done just before he comes in to see me in 6 months      and we will assess that echo for any evidence of progression.  2. Chronic obstructive pulmonary disease.  The patient is started on      Spiriva as well as Advair.  He does feel that Spiriva has helped.  3. Hypertension.  Blood pressure is a bit up today, but the patient is      also in pain in his groin, so I think we will continue on the same      meds for now.  4. Carotid artery stenosis.  We are awaiting the results of his      followup carotid artery ultrasound.  5. Hyperlipidemia.  The patient has started on Zocor 20 mg daily.  6. Smoking.  I did talk to the patient about stopping smoking      altogether.  7. Right groin cath site.  He does have a small hematoma.  There is no      bruit.  There are good pulses in his right foot.  I will have him      get an ultrasound of his groin to rule out pseudoaneurysm or AV      fistula.     [name], MD  Electronically Signed    DM/MedQ  DD: 02/19/2008  DT: 02/20/2008  Job #: [id number]   cc:   Jonna Coup, MD

## 2010-07-21 NOTE — Procedures (Signed)
[name], Wayne Guzman              ACCOUNT NO.:  [id number]   MEDICAL RECORD NO.:  [id number]           PATIENT TYPE:   LOCATION:                                 FACILITY:   PHYSICIAN:  Erick Colace, M.D.DATE OF BIRTH:  Mar 31, 1950   DATE OF PROCEDURE:  02/12/2008  DATE OF DISCHARGE:                               OPERATIVE REPORT   DIAGNOSIS:  Cervicalgia, 923.1.   INDICATION FOR PROCEDURE:  Severe neck pain, cervical spondylosis  without significant radicular component.  Pain is only partially  responsive to narcotic analgesics and interferes self-care mobility.   Informed consent was obtained after describing risks and benefits of the  procedure with the patient.  These include bleeding, bruising, infection  as well as temporary or permanent paralysis, he elects to proceed.  The  patient was placed in the lateral decubitus position.  Areas were  marked, prepped with Betadine x3, then a 27-gauge 5/8-inch needle was  inserted to anesthetize skin and subcu tissue, 1 mL of lidocaine at each  of 4 spots, then a 25-gauge 3-inch spinal needle was inserted under  fluoroscopic guidance targeting the midpoint of the articular pillar  starting at right C5.  AP and lateral images utilized.  A 0.3 mL of 1%  MPF lidocaine injected.  Then, the right C6 midpoint articular pillar  was targeted.  AP and lateral images utilized.  Bone contact made,  confirmed with lateral imaging.  Then, 0.3 mL of 1% MPF lidocaine  injected.  The same procedure was repeated at the same levels using same  technique on the left side.  The patient tolerated procedure well.  Pre  and post injection vitals stable.  Post injection instructions given.  Follow up in 3-4 weeks.  Pre and post levels are equal, however,  increased mobility of the neck.      Erick Colace, M.D.  Electronically Signed     AEK/MEDQ  D:  02/12/2008 11:02:39  T:  02/13/2008 01:40:30  Job:  [id number]

## 2010-07-21 NOTE — Procedures (Signed)
Wayne Guzman, Wayne Guzman              ACCOUNT NO.:  [id number]   MEDICAL RECORD NO.:  [id number]          PATIENT TYPE:  REC   LOCATION:  [facility]                          FACILITY:  [facility]   PHYSICIAN:  Erick Colace, M.D.DATE OF BIRTH:  04/29/1950   DATE OF PROCEDURE:  05/09/2008  DATE OF DISCHARGE:                               OPERATIVE REPORT   This is a right carpal tunnel injection.   INDICATIONS:  Carpal tunnel syndrome moderate-to-severe symptoms, which  interfere with sleep as well as with activities.   Pain is only partially responsive to medication management.   Informed consent was obtained after describing risks and benefits of the  procedure with the patient.  These include bleeding, bruising, and  infection.  He elects to proceed and has given written consent.  The  patient placed in the seated position.  His right wrist volar surface  was marked and prepped with Betadine entered with 27-gauge 5/8th-inch  needle angled distally.  Then, a 1% lidocaine solution x0.25 mL injected  to raise the skin wheal followed by insertion of a different 27-gauge  5/8th-inch needle and injection of Depo-Medrol 40 mg/mL x0.25 mL.  The  patient tolerated procedure well.  Injections done after negative  drawback of blood.      Erick Colace, M.D.  Electronically Signed     AEK/MEDQ  D:  05/09/2008 12:31:33  T:  05/09/2008 23:02:43  Job:  [id number]

## 2010-07-21 NOTE — Procedures (Signed)
NAMEKONA, LOVER              ACCOUNT NO.:  [id number]   MEDICAL RECORD NO.:  [id number]          PATIENT TYPE:  REC   LOCATION:  [facility]                          FACILITY:  [facility]   PHYSICIAN:  Celene Kras, MD        DATE OF BIRTH:  1951-01-28   DATE OF PROCEDURE:  05/14/2008  DATE OF DISCHARGE:                               OPERATIVE REPORT   Way[facility] Guz[facility].  I  evaluated him via health and history form 14-point review of systems.  1. I am going to go on to cervical facet medial branch intervention,      this will be reinforcement, by protocol, dense longer acting local      anesthetic, and I have given him benchmarks.  Should he have      significant response, I would consider going onto RF.  I have      reviewed that with him.  2. Again modifiable features in health profile discussed such as      cigarette cessation which is mandatory for best outcome.  3. He will maintain contact with primary care in this regard.      Questions were answered and discussed in lay terms.   Objectively, diffuse paracervical myofascial, positive cervical facet  compression test right and left.  Suboccipital compression test  positive.  Suprascapular and levator scapular pain.  Nothing new  neurologically.   IMPRESSION:  Spondylosis, minimal myelopathy.   PLAN:  Cervical facet medial branch intervention C4, C5, C6, and C7  right and left side independent needle access points under local  anesthetic, and he is consented.  A 0.5% Marcaine, discussed benchmarks.  No barrier to communication.   The patient taken to the fluoroscopy suite and placed in the supine  position and prepped, draped in the usual fashion using a 25 gauge  needle, I advanced to the cervical facet at the medial branch 5, 6, and  7 right and left side independent needle access points confirmed  placement, C4 as contributory innervation.  A 0.5% Marcaine, 0.5% MPF  was injected at  each level with total of 40 mg Aristocort in divided  dose.   He tolerated the procedure well.  No complications from our procedure.  Appropriate recovery.  Discharge instructions given except within the  context of activities of daily living.           ______________________________  Celene Kras, MD     HH/MEDQ  D:  05/14/2008 10:03:24  T:  05/14/2008 23:19:11  Job:  [id number]

## 2010-08-14 ENCOUNTER — Encounter: Payer: Self-pay | Admitting: Cardiovascular Disease

## 2010-09-20 ENCOUNTER — Emergency Department: Payer: Self-pay | Admitting: Emergency Medicine

## 2010-09-21 ENCOUNTER — Emergency Department: Payer: Self-pay | Admitting: Emergency Medicine

## 2010-10-09 ENCOUNTER — Emergency Department: Payer: Self-pay | Admitting: Emergency Medicine

## 2010-10-12 ENCOUNTER — Encounter: Payer: Self-pay | Admitting: Cardiology

## 2010-10-12 ENCOUNTER — Ambulatory Visit (INDEPENDENT_AMBULATORY_CARE_PROVIDER_SITE_OTHER): Payer: Medicaid Other | Admitting: Cardiology

## 2010-10-12 DIAGNOSIS — R0989 Other specified symptoms and signs involving the circulatory and respiratory systems: Secondary | ICD-10-CM

## 2010-10-12 DIAGNOSIS — I359 Nonrheumatic aortic valve disorder, unspecified: Secondary | ICD-10-CM

## 2010-10-12 DIAGNOSIS — J449 Chronic obstructive pulmonary disease, unspecified: Secondary | ICD-10-CM

## 2010-10-12 DIAGNOSIS — R0602 Shortness of breath: Secondary | ICD-10-CM

## 2010-10-12 DIAGNOSIS — I10 Essential (primary) hypertension: Secondary | ICD-10-CM

## 2010-10-12 DIAGNOSIS — I6529 Occlusion and stenosis of unspecified carotid artery: Secondary | ICD-10-CM

## 2010-10-12 DIAGNOSIS — E785 Hyperlipidemia, unspecified: Secondary | ICD-10-CM

## 2010-10-12 MED ORDER — METOPROLOL SUCCINATE ER 50 MG PO TB24: 50.0000 mg | ORAL_TABLET | Freq: Every day | ORAL | Status: DC

## 2010-10-12 NOTE — Assessment & Plan Note (Signed)
Exertional dyspnea seems stable compared to prior and certainly may be due primarily to COPD.  He had moderate AS by last imaging in 2010.  I will repeat an echo to assess aortic valve.  I will also get a BNP.

## 2010-10-12 NOTE — Progress Notes (Signed)
PCP: Dr. Lacie Scotts  60 yo with history of moderate aortic stenosis secondary to bicuspid aortic valve disorder and COPD presents for followup.  Patient has had a rough year since I last saw him.  On Christmas eve of last year, it sounds like he aspirated a pill.  He developed what sounds like aspiration PNA/pneumonitis and was intubated for 9 days at [facility].  He was dysphagic afterwards and had a feeding tube in place for a long period of time. He also recently shut his thumb in a car door and fractured it.  He was seen in the ER 2 weeks ago with tachycardia after using albuterol (sinus tachycardia).  He has not used albuterol since and has not felt tachypalpitations since that time.   Currently, patient is stable.  He has shortness of breath after walking about 1 block (chronic).  No chest pain.  ECG: NSR with PACs  Lipids (6/10): LDL 83, HDL 37  ECG: NSR, PAC and PVC  Allergies (verified):  No Known Drug Allergies  Past Medical History: 1. Bipolar disorder. 2. Chronic obstructive pulmonary disease.  PFTs done in November 2009,  showed an FVC of 108%, FEV1 of 64%, and FEF 25-75% of 30%.  There was 23% improvement in the FEV1 with a bronchodilator.  This suggests moderate obstructive defect.  The patient of note does continue to smoke.  He is trying to cut this down and is smoking 4 cigarettes a day. 3. Hypertension. 4. Chronic pain disorder.  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 5.  Mild to moderate aortic stenosis with bicuspid aortic valve:  He has had stable mild- moderate aortic stenosis.  Echocardiogram on January 24, 2008, showed an EF of 55-60% with no regional wall motion abnormalities.  Aortic stenosis appeared severe with a mean gradient of 42 mm of mercury and a dimensionless index to 0.24.  The aortic valve itself was not well visualized.  There was a question of the valve being bicuspid.  We did do a TEE also in November 2009,  showing that it is indeed a bicuspid aortic valve.  The planimetered valve area was 1.1 cm squared with a mean gradient 32 mm mercury.  There was no aortic coarctation.  There was no aneurysm of the thoracic aorta.  Given discrepant findings between exam (suggesting mild-moderate AS), TEE (suggesting moderate AS), and TTE (suggesting severe AS), pt had left and right heart catheterization on 02/14/08.  This showed normal LV and RV filling pressures.  Mean aortic valve gradient was 31 mmHg and calculated valve area was 1.0 cm2.  This suggests that the degree of stenosis was moderate.  Echo (6/10): Bicuspid aortic valve with mean gradient 20 mmHg and peak gradient 35 mmHg.  This suggests mild AS.   6. Carotid ultrasound done in December 2009 showed no significant carotid stenosis.  7. Left heart catheterization (02/14/08):  No angiographic CAD.  8. Bicuspid aortic valve disorder:  No thoracic aneurysm on TEE 11/09.  MR angiogram 12/10 with no thoracic aortic aneurysm.  9. Aspiration pneumonia/pneumonitis with prolonged ventilation in 12/11  Family History: The patient's father developed coronary artery disease in his early 57s.  No other history of premature coronary artery disease in the family.  No history that he knows of aortic stenosis.  Social History: The patient is disabled due to degenerative joint disease of the spine.  He smokes 3 cigarettes a day.  He smoked moreheavily in the past.  He is interested in quitting.  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  He does not use any illicit drugs.  Review of Systems        All systems reviewed and negative except as per HPI.   Current Outpatient Prescriptions  Medication Sig Dispense Refill  . ALPRAZolam (XANAX) 0.5 MG tablet Take 0.5 mg by mouth daily.        Marland Kitchen aspirin 81 MG EC tablet Take 81 mg by mouth daily.        . busPIRone (BUSPAR) 10 MG tablet Take 10 mg by mouth 2 (two) times daily.        .  clonazePAM (KLONOPIN) 1 MG tablet Take 1 mg by mouth 2 (two) times daily.        . divalproex (DEPAKOTE) 500 MG EC tablet Take 1,500 mg by mouth daily.        . Fluticasone-Salmeterol (ADVAIR DISKUS) 250-50 MCG/DOSE AEPB Inhale 2 puffs into the lungs daily.        Marland Kitchen OLANZapine (ZYPREXA) 10 MG tablet Take 10 mg by mouth at bedtime.        . simvastatin (ZOCOR) 20 MG tablet Take 20 mg by mouth at bedtime.        Marland Kitchen tiotropium (SPIRIVA) 18 MCG inhalation capsule Place 18 mcg into inhaler and inhale daily.        . metoprolol (TOPROL XL) 50 MG 24 hr tablet Take 1 tablet (50 mg total) by mouth daily.  30 tablet  11    BP 115/85  Pulse 92  Ht 5\' 9"  (1.753 m)  Wt 143 lb (64.864 kg)  BMI 21.12 kg/m2 General:  Well developed, well nourished, in no acute distress. Neck:  Neck supple, no JVD. No masses, thyromegaly or abnormal cervical nodes. Lungs:  Distant breath sounds, normal respiratory effort.  Heart:  Non-displaced PMI, chest non-tender; regular rate and rhythm, S1, S2 without rubs or gallops. There is a 3/6 systolic crescendo-decrescendo murmur at the RUSB.  It is mid-peaking.  S2 is heard clearly.  Murmur radiates to carotids.  Carotid upstroke normal.  Pedals normal pulses. No edema, no varicosities. Abdomen:  Bowel sounds positive; abdomen soft and non-tender without masses, organomegaly, or hernias noted. No hepatosplenomegaly. Extremities:  No clubbing or cyanosis. Neurologic:  Alert and oriented x 3. Psych:  Normal affect.

## 2010-10-12 NOTE — Patient Instructions (Addendum)
Your physician has requested that you have an echocardiogram. Echocardiography is a painless test that uses sound waves to create images of your heart. It provides your doctor with information about the size and shape of your heart and how well your heart's chambers and valves are working. This procedure takes approximately one hour. There are no restrictions for this procedure.  Your physician has requested that you have a carotid duplex. This test is an ultrasound of the carotid arteries in your neck. It looks at blood flow through these arteries that supply the brain with blood. Allow one hour for this exam. There are no restrictions or special instructions.  Your physician recommends that you return for a FASTING lipid profile: (when you come for echo) (BMP/BNP/lipid/lft)  Start Metoprolol Succinate 50mg  once daily.  Your physician recommends that you schedule a follow-up appointment in: 1 year

## 2010-10-12 NOTE — Assessment & Plan Note (Addendum)
Down to 3 cigarettes/day.  I again counselled him to quit.  He will continue Spiriva and Advair inhalers for COPD.  I suggested that he try the electronic cigarette.

## 2010-10-12 NOTE — Assessment & Plan Note (Signed)
Check lipids/LFTs today.  Patient is on simvastatin.

## 2010-10-12 NOTE — Assessment & Plan Note (Signed)
L>R carotid bruit versus radiation from AS murmur.  Will get carotid dopplers when he comes for echo.

## 2010-10-15 ENCOUNTER — Encounter: Payer: Self-pay | Admitting: *Deleted

## 2010-10-27 ENCOUNTER — Other Ambulatory Visit: Payer: Medicaid Other | Admitting: *Deleted

## 2010-10-27 ENCOUNTER — Encounter: Payer: Medicaid Other | Admitting: *Deleted

## 2010-11-06 ENCOUNTER — Other Ambulatory Visit: Payer: Self-pay | Admitting: Cardiology

## 2010-11-06 ENCOUNTER — Encounter: Payer: Medicaid Other | Admitting: *Deleted

## 2010-11-06 ENCOUNTER — Other Ambulatory Visit: Payer: Medicaid Other | Admitting: *Deleted

## 2010-11-10 ENCOUNTER — Encounter (INDEPENDENT_AMBULATORY_CARE_PROVIDER_SITE_OTHER): Payer: Medicaid Other | Admitting: *Deleted

## 2010-11-10 ENCOUNTER — Other Ambulatory Visit (INDEPENDENT_AMBULATORY_CARE_PROVIDER_SITE_OTHER): Payer: Medicaid Other | Admitting: *Deleted

## 2010-11-10 ENCOUNTER — Ambulatory Visit: Payer: Medicaid Other | Admitting: *Deleted

## 2010-11-10 DIAGNOSIS — I6529 Occlusion and stenosis of unspecified carotid artery: Secondary | ICD-10-CM

## 2010-11-10 DIAGNOSIS — I359 Nonrheumatic aortic valve disorder, unspecified: Secondary | ICD-10-CM

## 2010-11-10 DIAGNOSIS — R0989 Other specified symptoms and signs involving the circulatory and respiratory systems: Secondary | ICD-10-CM

## 2010-11-10 DIAGNOSIS — G458 Other transient cerebral ischemic attacks and related syndromes: Secondary | ICD-10-CM

## 2010-12-16 ENCOUNTER — Encounter: Payer: Self-pay | Admitting: Internal Medicine

## 2010-12-17 ENCOUNTER — Ambulatory Visit (INDEPENDENT_AMBULATORY_CARE_PROVIDER_SITE_OTHER): Payer: Medicaid Other | Admitting: Cardiology

## 2010-12-17 ENCOUNTER — Encounter: Payer: Self-pay | Admitting: Cardiology

## 2010-12-17 DIAGNOSIS — E785 Hyperlipidemia, unspecified: Secondary | ICD-10-CM

## 2010-12-17 DIAGNOSIS — J449 Chronic obstructive pulmonary disease, unspecified: Secondary | ICD-10-CM

## 2010-12-17 DIAGNOSIS — R0989 Other specified symptoms and signs involving the circulatory and respiratory systems: Secondary | ICD-10-CM

## 2010-12-17 DIAGNOSIS — Q231 Congenital insufficiency of aortic valve: Secondary | ICD-10-CM

## 2010-12-17 DIAGNOSIS — I359 Nonrheumatic aortic valve disorder, unspecified: Secondary | ICD-10-CM

## 2010-12-17 DIAGNOSIS — I771 Stricture of artery: Secondary | ICD-10-CM

## 2010-12-17 NOTE — Assessment & Plan Note (Signed)
Mild to moderate LICA stenosis, followup in 1 year.

## 2010-12-17 NOTE — Progress Notes (Signed)
PCP: Dr. Lacie Scotts  60 yo with history of aortic stenosis secondary to bicuspid aortic valve disorder and COPD presents for followup.  He reports increased shortness of breath over the last couple of months.  He has to stop after walking about 2 blocks due to dyspnea. He is short of breath with steps.  No chest pain.  He has had episodes of lightheadedness with standing and while walking.  These episodes will resolve with sitting down.  He has not passed out but has felt like he was close.  He is still trying to quit smoking.  He smokes about 3-4 cigarettes a day.    Echo in 9/12 was concerning for severe aortic stenosis.  He also had carotid dopplers showing mild to moderate LICA stenosis but severe ostial left subclavian stenosis with steal.  Patient denies any left arm claudication or numbness.  No stroke-like symptoms.    Lipids (6/10): LDL 83, HDL 37 Labs (9/12): K 4, creatinine 0.68, HDL 44, LDL 72, BNP 185  Allergies (verified):  No Known Drug Allergies  Past Medical History: 1. Bipolar disorder. 2. Chronic obstructive pulmonary disease.  PFTs done in November 2009,  showed an FVC of 108%, FEV1 of 64%, and FEF 25-75% of 30%.  There was 23% improvement in the FEV1 with a bronchodilator.  This suggests moderate obstructive defect.  The patient of note does continue to smoke.   3. Hypertension. 4. Chronic pain disorder.  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 5.  Severe aortic stenosis with bicuspid aortic valve:  This has been followed since 2009.  Echocardiogram on January 24, 2008, showed an EF of 55-60% with no regional wall motion abnormalities.  Aortic stenosis appeared severe with a mean gradient of 42 mm of mercury and a dimensionless index to 0.24.  The aortic valve itself was not well visualized.  There was a question of the valve being bicuspid.  I did a TEE also in November 2009, showing that it is indeed a bicuspid aortic  valve.  The planimetered valve area was 1.1 cm squared with a mean gradient 32 mm mercury.  There was no aortic coarctation.  There was no aneurysm of the thoracic aorta.  Given discrepant findings between exam (suggesting mild-moderate AS), TEE (suggesting moderate AS), and TTE (suggesting severe AS), pt had left and right heart catheterization on 02/14/08.  This showed normal LV and RV filling pressures.  Mean aortic valve gradient was 31 mmHg and calculated valve area was 1.0 cm2.  This suggests that the degree of stenosis was moderate.  Echo (6/10): Bicuspid aortic valve with mean gradient 20 mmHg and peak gradient 35 mmHg.  Echo (9/12) with EF 50-55%, grade I diastolic dysfunction, bicuspid aortic valve with mean gradient 42 mmHg and peak gradient 74 mmHg; aortic valve area 0.8 cm^2, mild MR.   6. Carotid stenosis: 40-59% LICA stenosis on 9/12 carotid dopplers.  7. Left heart catheterization (02/14/08):  No angiographic CAD.  8. Bicuspid aortic valve disorder:  No thoracic aneurysm on TEE 11/09.  MR angiogram 12/10 with no thoracic aortic aneurysm.  9. Aspiration pneumonia/pneumonitis with prolonged ventilation in 12/11 10. Subclavian stenosis: Severe ostial left subclavian stenosis with steal and 50 mmHg brachial pressure gradient on 9/12 doppler evaluation.  11. Hyperlipidemia  Family History: The patient's father developed coronary artery disease in his early 58s.  No other history of premature coronary artery disease in the family.  No history that he  knows of aortic stenosis.  Social History: The patient is disabled due to degenerative joint disease of the spine.  He smokes 3 cigarettes a day.  He smoked more heavily in the past.  He is interested in quitting.  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  He does not use any illicit drugs.  Review of Systems        All systems reviewed and negative except as per HPI.   Current Outpatient Prescriptions    Medication Sig Dispense Refill  . ALPRAZolam (XANAX) 0.5 MG tablet Take 0.5 mg by mouth daily.        Marland Kitchen aspirin 81 MG EC tablet Take 81 mg by mouth daily.        . busPIRone (BUSPAR) 10 MG tablet Take 10 mg by mouth 2 (two) times daily.        . clonazePAM (KLONOPIN) 1 MG tablet Take 1 mg by mouth 2 (two) times daily.        . divalproex (DEPAKOTE) 500 MG EC tablet Take 1,500 mg by mouth daily.        . Fluticasone-Salmeterol (ADVAIR DISKUS) 250-50 MCG/DOSE AEPB Inhale 2 puffs into the lungs daily.        . metoprolol (TOPROL XL) 50 MG 24 hr tablet Take 1 tablet (50 mg total) by mouth daily.  30 tablet  11  . OLANZapine (ZYPREXA) 10 MG tablet Take 10 mg by mouth at bedtime.        . simvastatin (ZOCOR) 20 MG tablet Take 20 mg by mouth at bedtime.        Marland Kitchen tiotropium (SPIRIVA) 18 MCG inhalation capsule Place 18 mcg into inhaler and inhale daily.          BP 110/60  Pulse 65  Ht 5' 10.5" (1.791 m)  Wt 143 lb (64.864 kg)  BMI 20.23 kg/m2 General:  Well developed, well nourished, in no acute distress. Neck:  Neck supple, no JVD. No masses, thyromegaly or abnormal cervical nodes. Lungs:  Distant breath sounds, normal respiratory effort.  Heart:  Non-displaced PMI, chest non-tender; regular rate and rhythm, S1, S2 without rubs or gallops. There is a 3/6 systolic crescendo-decrescendo murmur at the RUSB.  It is mid to late-peaking.  S2 is mildly obscured.  Murmur radiates to carotids.  Pedals normal pulses. No edema, no varicosities. Abdomen:  Bowel sounds positive; abdomen soft and non-tender without masses, organomegaly, or hernias noted. No hepatosplenomegaly. Extremities:  No clubbing or cyanosis. Neurologic:  Alert and oriented x 3. Psych:  Normal affect.

## 2010-12-17 NOTE — Patient Instructions (Addendum)
Your physician has requested that you have a cardiac MRI. Cardiac MRI uses a computer to create images of your heart as its beating, producing both still and moving pictures of your heart and major blood vessels. For further information please visit [email or web address]. Please follow the instruction sheet given to you today for more information. (This will be done on the same day as appt with Dr. Cornelius Moras).  Your physician recommends that you schedule a follow-up appointment with Dr. Cornelius Moras. We will call you with this appt.   Your physician recommends that you have Right and Left cardiac cath. We will call with appt/ instructions.  Your physician recommends that you schedule a follow-up appointment in: 2 weeks with Dr. Shirlee Latch

## 2010-12-17 NOTE — Assessment & Plan Note (Addendum)
I have been following Wayne Guzman's bicuspid aortic stenosis since 2009.  Stenosis had appeared to be moderate in the past.  Lately, he has had increased exertional dyspnea (does have history of COPD) as well as exertional lightheadedness.  Aortic stenosis appeared severe on 9/12 echo.  EF was borderline reduced (50-55%).  Additionally, his BNP was elevated mildly.  I think that at this point given symptoms, some decrease in LV systolic function, and elevation in BNP, it is time to replace the valve.  He will need re-evaluation of the thoracic aorta given risk of aortic aneurysm with bicuspid aortic valve.   - Left and right heart cath.  - MRA chest to assess thoracic aorta for aneurysm.   - I think that a mechanical valve would be the best choice given his age and no prior history of GI or other bleeding.   - I will refer him to CVTS .

## 2010-12-17 NOTE — Assessment & Plan Note (Addendum)
Some of patient's exertional dyspnea can certainly be attributed to COPD.  However, significant worsening over the last few months may be secondary to severe AS .  He needs to quit smoking.  I strongly recommended this prior to any valve surgery.

## 2010-12-17 NOTE — Assessment & Plan Note (Signed)
Severe ostial left subclavian stenosis by doppler evaluation.  Patient does not have arm claudication or weakness and has not had neurological symptoms consistent with subclavian steal.  I do not think that intervention is indicated at this point.  Continue to follow.

## 2010-12-17 NOTE — Assessment & Plan Note (Signed)
Goal LDL < 70 with known vascular disease (subclavian and carotid stenoss).  Last LDL was close to goal in 9/12.

## 2010-12-21 ENCOUNTER — Telehealth: Payer: Self-pay | Admitting: Cardiovascular Disease

## 2010-12-21 NOTE — Telephone Encounter (Signed)
PATIENT WOULD LIKE TO LET RN KNOW THAT APPOINTMENT WITH DR. OWEN HE WANTS SCHEDULED IN November DUE TO MONEY.

## 2010-12-22 ENCOUNTER — Other Ambulatory Visit: Payer: Self-pay | Admitting: Cardiology

## 2010-12-22 ENCOUNTER — Encounter: Payer: Self-pay | Admitting: *Deleted

## 2010-12-22 ENCOUNTER — Other Ambulatory Visit: Payer: Self-pay | Admitting: *Deleted

## 2010-12-22 DIAGNOSIS — I359 Nonrheumatic aortic valve disorder, unspecified: Secondary | ICD-10-CM

## 2010-12-22 DIAGNOSIS — Z01810 Encounter for preprocedural cardiovascular examination: Secondary | ICD-10-CM

## 2010-12-22 NOTE — Telephone Encounter (Signed)
Spoke to pt, he just wants to make sure his appts are at the very beginning of the month due to disability check coming in then, so he can pay driver for all his procedures. I have scheduled cath for Nov 2, and chest MRA pending, pt will then f/u with Dr. Cornelius Moras for valve consult. Pt ok with this.

## 2010-12-23 ENCOUNTER — Other Ambulatory Visit: Payer: Self-pay | Admitting: *Deleted

## 2010-12-23 DIAGNOSIS — Z01810 Encounter for preprocedural cardiovascular examination: Secondary | ICD-10-CM

## 2010-12-23 DIAGNOSIS — I359 Nonrheumatic aortic valve disorder, unspecified: Secondary | ICD-10-CM

## 2010-12-29 ENCOUNTER — Encounter: Payer: Self-pay | Admitting: *Deleted

## 2011-01-01 ENCOUNTER — Telehealth: Payer: Self-pay | Admitting: *Deleted

## 2011-01-01 NOTE — Telephone Encounter (Signed)
Called and spoke to pt, notified of appt with Dr. Cornelius Moras for 01/11/11 and to arrive at 1:00pm. Pt will bring med list, insurance card(s), and photo id.

## 2011-01-05 ENCOUNTER — Encounter: Payer: Self-pay | Admitting: *Deleted

## 2011-01-05 ENCOUNTER — Other Ambulatory Visit: Payer: Medicaid Other | Admitting: *Deleted

## 2011-01-05 ENCOUNTER — Ambulatory Visit (INDEPENDENT_AMBULATORY_CARE_PROVIDER_SITE_OTHER): Payer: Medicaid Other | Admitting: *Deleted

## 2011-01-05 DIAGNOSIS — I359 Nonrheumatic aortic valve disorder, unspecified: Secondary | ICD-10-CM

## 2011-01-05 DIAGNOSIS — Z01810 Encounter for preprocedural cardiovascular examination: Secondary | ICD-10-CM

## 2011-01-06 ENCOUNTER — Ambulatory Visit (INDEPENDENT_AMBULATORY_CARE_PROVIDER_SITE_OTHER): Payer: Medicaid Other | Admitting: *Deleted

## 2011-01-06 ENCOUNTER — Encounter: Payer: Self-pay | Admitting: Gastroenterology

## 2011-01-06 ENCOUNTER — Encounter: Payer: Self-pay | Admitting: Cardiology

## 2011-01-06 ENCOUNTER — Other Ambulatory Visit: Payer: Self-pay | Admitting: Cardiology

## 2011-01-06 DIAGNOSIS — R5383 Other fatigue: Secondary | ICD-10-CM

## 2011-01-06 DIAGNOSIS — R710 Precipitous drop in hematocrit: Secondary | ICD-10-CM

## 2011-01-06 DIAGNOSIS — D649 Anemia, unspecified: Secondary | ICD-10-CM

## 2011-01-06 LAB — CBC WITH DIFFERENTIAL
Eosinophils Absolute: 0.3 10*3/uL (ref 0.0–0.4)
Immature Grans (Abs): 0 10*3/uL (ref 0.0–0.1)
Immature Granulocytes: 0 % (ref 0–2)
MCH: 22.2 pg — ABNORMAL LOW (ref 26.6–33.0)
MCHC: 30.9 g/dL — ABNORMAL LOW (ref 31.5–35.7)
Monocytes Absolute: 1.4 10*3/uL — ABNORMAL HIGH (ref 0.1–1.0)
Neutrophils Relative %: 35 % — ABNORMAL LOW (ref 40–74)
Platelets: 366 10*3/uL (ref 140–415)
RBC: 4.15 x10E6/uL (ref 4.14–5.80)

## 2011-01-06 LAB — BASIC METABOLIC PANEL
BUN: 12 mg/dL (ref 8–27)
CO2: 25 mmol/L (ref 20–32)
Calcium: 8.8 mg/dL (ref 8.6–10.2)
Creatinine, Ser: 0.86 mg/dL (ref 0.76–1.27)
GFR calc non Af Amer: 94 mL/min/{1.73_m2} (ref >59)
Glucose: 82 mg/dL (ref 65–99)
Sodium: 136 mmol/L (ref 134–144)

## 2011-01-06 LAB — PROTIME-INR: INR: 1.1 (ref 0.8–1.2)

## 2011-01-07 LAB — FERRITIN: Ferritin: 9 ng/mL — ABNORMAL LOW (ref 22–322)

## 2011-01-07 LAB — IRON AND TIBC
%SAT: 11 % — ABNORMAL LOW (ref 20–55)
Iron: 47 ug/dL (ref 42–165)

## 2011-01-08 ENCOUNTER — Ambulatory Visit (HOSPITAL_COMMUNITY): Admission: RE | Admit: 2011-01-08 | Payer: Medicaid Other | Source: Ambulatory Visit | Admitting: Cardiology

## 2011-01-08 ENCOUNTER — Telehealth: Payer: Self-pay | Admitting: *Deleted

## 2011-01-08 NOTE — Telephone Encounter (Signed)
I had him scheduled for [facility], but his "transportation service" will not take him here because there is closer option. So I had to cancel this one and r/s with [facility]. I will call Monday to try to get sooner, I don't know what else to do. He cannot drive, I have been back and forth talking with this trans service and the pt, very difficult trying to coordinate with both and there are a lot of requirements involved with this service. Will do my best.

## 2011-01-08 NOTE — Telephone Encounter (Signed)
Please arrange GI appointment for sooner.  He needs to be seen in the next couple of weeks.  If it can't be done in [place], use [facility].

## 2011-01-08 NOTE — Telephone Encounter (Signed)
Pt has informed us recently that he has to have all transportation for any procedures taken care of through [employer] [phone]). This is due to Medicaid, pt cannot drive, and this company has to have a 5 day notice of any appt in [place] AND a letter from the ordering MD stating it is medically necessary. For any local appts only 3 day notice. I have spoken to trans svc, and if there is any provider local that can provide same care then he is required to go there. GI at [facility] cancelled and he has appt with Dr. Bluford Kaufmann at [facility] on 02/15/11 @ 10:00am.  [name] has been cancelled due to Fe deficiency anemia and will not be r/s until after GI workup/colonoscopy. [name] with Dr. Cornelius Moras has been r/s for [facility] 11/12. I have spoken with Marlowe Kays to r/s MRA, will probably be put on for 11/13, waiting for confirmation. Will send letter for medical necessity, Dr. Shirlee Latch please advise otherwise.

## 2011-01-09 NOTE — Telephone Encounter (Signed)
See if you can convince him to just get a friend to drive him to an appointment.

## 2011-01-11 ENCOUNTER — Encounter: Payer: Self-pay | Admitting: Thoracic Surgery (Cardiothoracic Vascular Surgery)

## 2011-01-11 NOTE — Telephone Encounter (Signed)
I have spoken to [facility], spoke with Avis Epley, RN. She is talking to Dr. Markham Jordan to get pt in sooner. Waiting for call back.

## 2011-01-12 ENCOUNTER — Inpatient Hospital Stay (HOSPITAL_COMMUNITY): Admission: RE | Admit: 2011-01-12 | Payer: Medicaid Other | Source: Ambulatory Visit

## 2011-01-12 NOTE — Telephone Encounter (Signed)
Pt is scheduled for [facility], [facility] next week, will have GI workup after cath per Dr. Shirlee Latch, ok.

## 2011-01-18 ENCOUNTER — Institutional Professional Consult (permissible substitution) (INDEPENDENT_AMBULATORY_CARE_PROVIDER_SITE_OTHER): Payer: Medicaid Other | Admitting: Thoracic Surgery (Cardiothoracic Vascular Surgery)

## 2011-01-18 ENCOUNTER — Encounter (HOSPITAL_COMMUNITY): Payer: Self-pay | Admitting: Pharmacy Technician

## 2011-01-18 ENCOUNTER — Encounter: Payer: Self-pay | Admitting: *Deleted

## 2011-01-18 ENCOUNTER — Encounter: Payer: Self-pay | Admitting: Thoracic Surgery (Cardiothoracic Vascular Surgery)

## 2011-01-18 VITALS — BP 100/64 | HR 88 | Resp 16 | Ht 69.5 in | Wt 145.0 lb

## 2011-01-18 DIAGNOSIS — I359 Nonrheumatic aortic valve disorder, unspecified: Secondary | ICD-10-CM

## 2011-01-18 NOTE — Patient Instructions (Signed)
Stop smoking ASAP 

## 2011-01-18 NOTE — Progress Notes (Signed)
PCP is Evelene Croon, MD Referring Provider is [name], MD  Chief Complaint  Patient presents with  . Aortic Stenosis    Bicuspid Aortic Vavlve    HPI:  60 yo disabled single white male from [place] with known history of bicuspid aortic valve with aortic stenosis followed by Dr. Shirlee Latch since 2009.  The patient has worsening symptoms of exertional shortness of breath as well as occasional episodes of dizziness, lightheadedness, and syncope. The patient reports to seizure like episodes over the past 5 months during which time he felt dizzy with rapid heartbeat and then ultimately passed out. On both occasions he was told that he probably had a seizure. Followup echocardiogram demonstrates severe aortic stenosis with normal left ventricular systolic function. The patient has been referred to consider elective aortic valve replacement.   Past Medical History  Diagnosis Date  . Bipolar disorder   . COPD (chronic obstructive pulmonary disease)     PFTs done in 11/09 show FVC of 108%, FEV! of 64%, and FEF25-25% of 30%. There was 23% improvment in the FEV1 with a bronchodilator. This suggests moderate obstructive defect. The patient of note does continue to smoke. He is trying to cut this down and is smoking 4 cigarettes a day.  . Hypertension   . Chronic pain disorder     Pt has a history of cervical and lumbar pain vertebral from DJD  . Aortic stenosis     Mild to moderate. Appeared severe with a mean gradient of 42 mm of mercury and a dimensionless index to 0.24. The aorti valve itself was not well visualized.  . Bicuspid aortic valve     TEE in 11/09. Planimetered valve area was 1.1 cm sqaured witha a mean gradient 32 mmHg. No aortic coarctation. No aneurysm of thoracic aorta.  Marland Kitchen DJD (degenerative joint disease) of cervical spine   . DJD (degenerative joint disease) of lumbar spine   . Seizures     details unclear, 2 episodes last 5 months  . Respiratory failure 02/28/2010    [facility]    Past Surgical History  Procedure Date  . Doppler echocardiography 01/24/2008    EF of 55-60% with not regional wall abnormalities  . Cardiac catheterization 02/13/2001    Left and Right, showed normal LV and RV filling pressures.  Mean aortic valve gradient was 31 mmHg and calculated valve area was 1.0 cm sq. Suggests degree of stenosis was moderate.  . Doppler echocardiography 08/2008    Bicuspid aortic valve with mean gradient 20 mmHg and peak gradient mmHg, this suggests mild AS  . Carotid ultrasound 02/2008    No significant carotid stenosis    Family History  Problem Relation Age of Onset  . Coronary artery disease Father   . Aortic stenosis Neg Hx     Social History History  Substance Use Topics  . Smoking status: Current Everyday Smoker  . Smokeless tobacco: Not on file   Comment: Smokes 3 cigarettes a day. Smoked more heavily in the past, interested in quitting.  . Alcohol Use: No     Abused alcohol in the past    Current Outpatient Prescriptions  Medication Sig Dispense Refill  . ALPRAZolam (XANAX) 0.5 MG tablet Take 0.5 mg by mouth daily.        Marland Kitchen aspirin 81 MG EC tablet Take 81 mg by mouth daily.        . busPIRone (BUSPAR) 10 MG tablet Take 10 mg by mouth 2 (two) times daily.        Marland Kitchen  clonazePAM (KLONOPIN) 1 MG tablet Take 1 mg by mouth 2 (two) times daily.        . divalproex (DEPAKOTE) 500 MG EC tablet Take 1,500 mg by mouth daily.        . Fluticasone-Salmeterol (ADVAIR DISKUS) 250-50 MCG/DOSE AEPB Inhale 2 puffs into the lungs daily.        . metoprolol (TOPROL XL) 50 MG 24 hr tablet Take 1 tablet (50 mg total) by mouth daily.  30 tablet  11  . OLANZapine (ZYPREXA) 10 MG tablet Take 10 mg by mouth at bedtime.        . simvastatin (ZOCOR) 20 MG tablet Take 20 mg by mouth at bedtime.        Marland Kitchen tiotropium (SPIRIVA) 18 MCG inhalation capsule Place 18 mcg into inhaler and inhale daily.          No Known Allergies  Review of Systems  Constitutional:  Positive for fatigue and unexpected weight change. Negative for fever and chills.       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  Patient states jaw pain affects his ability to eat.  Recent broken tooth with remainder extracted by dentist 2 weeks ago.  Eyes: Negative.   Respiratory: Positive for shortness of breath. Negative for chest tightness.   Cardiovascular: Positive for palpitations. Negative for chest pain and leg swelling.       Moderate exertional SOB.  No resting SOB. No PND. No orthopnea.  + dizzy spells and possible syncope.  No chest pain.  Gastrointestinal:       Some difficulty swallowing with tendency to aspirate, dating back to hospitalization last year.  Genitourinary: Negative.   Musculoskeletal: Positive for back pain.       On disability due to chronic back pain which is currently controlled using intermittent injections.  Neurological: Positive for dizziness, seizures, syncope, weakness and light-headedness. Negative for headaches.       No focal symptoms.  No pain/weakness involving left upper extremity  Hematological: Negative.   Psychiatric/Behavioral:       + nervousness, depression    BP 100/64  Pulse 88  Resp 16  Ht 5' 9.5" (1.765 m)  Wt 145 lb (65.772 kg)  BMI 21.11 kg/m2  SpO2 99% Physical Exam  Constitutional: He is oriented to person, place, and time.       Thin, nervous-appearing  HENT:  Head: Normocephalic and atraumatic.  Eyes: Pupils are equal, round, and reactive to light.  Neck: Normal range of motion. Neck supple. No JVD present. No thyromegaly present.  Cardiovascular: Normal rate and regular rhythm.   Murmur heard.      Grade IV/VI systolic murmur at sternal border.  Distal pulses not palpable in either lower extremity at the ankle.  Pulmonary/Chest: Effort normal and breath sounds normal. He has no wheezes. He has no rales.  Abdominal: Soft. Bowel  sounds are normal. He exhibits no mass. There is no tenderness.  Musculoskeletal: Normal range of motion.  Lymphadenopathy:    He has no cervical adenopathy.  Neurological: He is alert and oriented to person, place, and time. He has normal reflexes.  Skin: Skin is warm and dry.  Psychiatric: He has a normal mood and affect. His behavior is normal. Judgment and thought content normal.     Diagnostic Tests:  2-D echocardiogram performed 11/10/2010 is reviewed. This demonstrates severe  aortic stenosis. The aortic valve appears to be bicuspid. There is severe restriction of both leaflets. The peak velocity across the valve was measured 424 cm/s. This corresponds to peak and mean transvalvular gradients of 74 and 42 mm mercury respectively. Estimated aortic valve area ranged between 0.76 and 0.79 cm. The LVOT diameter was measured 21 mm.  Left ventricular ejection fraction was estimated at 50-55%. There was mild concentric left ventricular hypertrophy with no segmental wall motion abnormalities. There was mild mitral regurgitation. No other significant abnormalities were noted.   Impression:  The patient is a 60 year old male with long-standing tobacco abuse and chronic obstructive pulmonary disease with likely bicuspid aortic valve and severe aortic stenosis. The patient has progressive symptoms of exertional shortness of breath. In addition, the patient describes intermittent dizzy spells and 2 seizure-like episodes over the past 5 months that may in fact be syncopal episodes related to the patient's aortic stenosis. The patient has known peripheral vascular disease as well as severe left subclavian artery stenosis. He was hospitalized at [facility] last year with a prolonged episode of respiratory failure of unclear circumstances. The patient describes continued problems with difficulty swallowing, weight loss, and pain with chewing and swallowing since that hospitalization last  year. I agree that based upon his describes symptoms and clear presence of severe aortic stenosis, that he will need to have aortic valve replacement performed. Cardiac MRA and cardiac catheterization have both been scheduled and will be performed later this week.   Plan:  We will obtain records from [facility] to find out exactly what happened to the patient last year. We await results of catheterization and cardiac MRA scheduled for later this week. We will see the patient back next week for further followup. I spent in excess of 30 minutes discussing the indications, risks, and potential benefits of aortic valve replacement with the patient here in the office today. We also discussed whether or not to replace his valve using a mechanical prosthesis or a bioprosthetic tissue valve. The relative risks and benefits of each been discussed. All of his questions been addressed.

## 2011-01-19 ENCOUNTER — Ambulatory Visit (HOSPITAL_COMMUNITY)
Admission: RE | Admit: 2011-01-19 | Discharge: 2011-01-19 | Disposition: A | Discharge status: Home / Self Care | Payer: Medicaid Other | Source: Ambulatory Visit | Attending: Cardiology | Admitting: Cardiology

## 2011-01-19 DIAGNOSIS — I359 Nonrheumatic aortic valve disorder, unspecified: Secondary | ICD-10-CM

## 2011-01-19 DIAGNOSIS — Q231 Congenital insufficiency of aortic valve: Secondary | ICD-10-CM

## 2011-01-19 MED ORDER — DIAZEPAM 5 MG PO TABS
5.0000 mg | ORAL_TABLET | Freq: Once | ORAL | Status: AC
  Administered 2011-01-19: 5 mg via ORAL
  Filled 2011-01-19: qty 1

## 2011-01-19 MED ORDER — DIAZEPAM 5 MG PO TABS
ORAL_TABLET | ORAL | Status: AC
  Administered 2011-01-19: 5 mg via ORAL
  Filled 2011-01-19: qty 1

## 2011-01-19 MED ORDER — GADOBENATE DIMEGLUMINE 529 MG/ML IV SOLN
15.0000 mL | Freq: Once | INTRAVENOUS | Status: AC
  Administered 2011-01-19: 15 mL via INTRAVENOUS

## 2011-01-22 ENCOUNTER — Encounter (HOSPITAL_COMMUNITY)
Admission: RE | Disposition: A | Discharge status: Home / Self Care | Payer: Self-pay | Source: Ambulatory Visit | Attending: Cardiology

## 2011-01-22 ENCOUNTER — Ambulatory Visit (HOSPITAL_COMMUNITY)
Admission: RE | Admit: 2011-01-22 | Discharge: 2011-01-22 | Disposition: A | Discharge status: Home / Self Care | Payer: Medicaid Other | Source: Ambulatory Visit | Attending: Cardiology | Admitting: Cardiology

## 2011-01-22 DIAGNOSIS — J4489 Other specified chronic obstructive pulmonary disease: Secondary | ICD-10-CM | POA: Insufficient documentation

## 2011-01-22 DIAGNOSIS — Q231 Congenital insufficiency of aortic valve: Secondary | ICD-10-CM | POA: Insufficient documentation

## 2011-01-22 DIAGNOSIS — J449 Chronic obstructive pulmonary disease, unspecified: Secondary | ICD-10-CM | POA: Insufficient documentation

## 2011-01-22 DIAGNOSIS — I10 Essential (primary) hypertension: Secondary | ICD-10-CM | POA: Insufficient documentation

## 2011-01-22 DIAGNOSIS — F17200 Nicotine dependence, unspecified, uncomplicated: Secondary | ICD-10-CM | POA: Insufficient documentation

## 2011-01-22 DIAGNOSIS — F319 Bipolar disorder, unspecified: Secondary | ICD-10-CM | POA: Insufficient documentation

## 2011-01-22 DIAGNOSIS — I359 Nonrheumatic aortic valve disorder, unspecified: Secondary | ICD-10-CM

## 2011-01-22 HISTORY — PX: LEFT AND RIGHT HEART CATHETERIZATION WITH CORONARY ANGIOGRAM: SHX5449

## 2011-01-22 LAB — POCT I-STAT 3, VENOUS BLOOD GAS (G3P V)
Acid-Base Excess: 1 mmol/L (ref 0.0–2.0)
Bicarbonate: 27.2 mEq/L — ABNORMAL HIGH (ref 20.0–24.0)
O2 Saturation: 63 %
pO2, Ven: 34 mmHg (ref 30.0–45.0)

## 2011-01-22 LAB — CBC
Hemoglobin: 9.5 g/dL — ABNORMAL LOW (ref 13.0–17.0)
MCH: 22.1 pg — ABNORMAL LOW (ref 26.0–34.0)
MCHC: 30.7 g/dL (ref 30.0–36.0)
MCV: 71.9 fL — ABNORMAL LOW (ref 78.0–100.0)
Platelets: 243 10*3/uL (ref 150–400)
RBC: 4.3 MIL/uL (ref 4.22–5.81)

## 2011-01-22 LAB — POCT I-STAT 3, ART BLOOD GAS (G3+)
Acid-Base Excess: 2 mmol/L (ref 0.0–2.0)
O2 Saturation: 97 %
TCO2: 29 mmol/L (ref 0–100)
pCO2 arterial: 45.7 mmHg — ABNORMAL HIGH (ref 35.0–45.0)

## 2011-01-22 LAB — BASIC METABOLIC PANEL
BUN: 10 mg/dL (ref 6–23)
Calcium: 9.4 mg/dL (ref 8.4–10.5)
Chloride: 99 mEq/L (ref 96–112)
Creatinine, Ser: 0.73 mg/dL (ref 0.50–1.35)
GFR calc Af Amer: >90 mL/min (ref >90)

## 2011-01-22 LAB — PROTIME-INR: Prothrombin Time: 15.1 seconds (ref 11.6–15.2)

## 2011-01-22 SURGERY — LEFT AND RIGHT HEART CATHETERIZATION WITH CORONARY ANGIOGRAM
Anesthesia: LOCAL

## 2011-01-22 MED ORDER — ONDANSETRON HCL 4 MG/2ML IJ SOLN: 4.0000 mg | Freq: Four times a day (QID) | INTRAMUSCULAR | Status: DC | PRN

## 2011-01-22 MED ORDER — LIDOCAINE HCL (PF) 1 % IJ SOLN
INTRAMUSCULAR | Status: AC
  Filled 2011-01-22: qty 30

## 2011-01-22 MED ORDER — SODIUM CHLORIDE 0.9 % IV SOLN
INTRAVENOUS | Status: DC
  Administered 2011-01-22: 1000 mL via INTRAVENOUS

## 2011-01-22 MED ORDER — ASPIRIN 81 MG PO CHEW
CHEWABLE_TABLET | ORAL | Status: AC
  Administered 2011-01-22: 324 mg via ORAL
  Filled 2011-01-22: qty 4

## 2011-01-22 MED ORDER — ACETAMINOPHEN 325 MG PO TABS: 650.0000 mg | ORAL_TABLET | ORAL | Status: DC | PRN

## 2011-01-22 MED ORDER — MIDAZOLAM HCL 2 MG/2ML IJ SOLN
INTRAMUSCULAR | Status: AC
  Filled 2011-01-22: qty 2

## 2011-01-22 MED ORDER — ASPIRIN 81 MG PO CHEW
324.0000 mg | CHEWABLE_TABLET | ORAL | Status: AC
  Administered 2011-01-22: 324 mg via ORAL

## 2011-01-22 MED ORDER — HEPARIN (PORCINE) IN NACL 2-0.9 UNIT/ML-% IJ SOLN
INTRAMUSCULAR | Status: AC
  Filled 2011-01-22: qty 2000

## 2011-01-22 MED ORDER — SODIUM CHLORIDE 0.9 % IV SOLN: 1.0000 mL/kg/h | INTRAVENOUS | Status: DC

## 2011-01-22 MED ORDER — NITROGLYCERIN 0.2 MG/ML ON CALL CATH LAB
INTRAVENOUS | Status: AC
  Filled 2011-01-22: qty 1

## 2011-01-22 MED ORDER — FENTANYL CITRATE 0.05 MG/ML IJ SOLN
INTRAMUSCULAR | Status: AC
  Filled 2011-01-22: qty 2
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
## 2011-01-22 NOTE — Op Note (Signed)
Cardiac Catheterization Procedure Note  Name: Wayne Guzman MRN: [id number] DOB: [birth year]  Procedure: Right Heart Cath, Left Heart Cath, Selective Coronary Angiography.  Indication:    Procedural Details: The right groin was prepped, draped, and anesthetized with 1% lidocaine. Using the modified Seldinger technique a 5 French sheath was placed in the right femoral artery and a 7 French sheath was placed in the right femoral vein. A Swan-Ganz catheter was used for the right heart catheterization. Standard protocol was followed for recording of right heart pressures and sampling of oxygen saturations. Fick cardiac output was calculated. Standard Judkins catheters were used for selective coronary angiography. LV-gram was not done (recent normal EF on echo).  The aortic valve was crossed for pressure.  There were no immediate procedural complications. The patient was transferred to the post catheterization recovery area for further monitoring.  Procedural Findings: Hemodynamics (in mmHg) RA 6 (mean) RV 29/6 PA 25/10 PCWP 9 (mean) LV 165/14 AO 134/74  Oxygen saturations: PA 63% AO 99%  Cardiac Output (Fick) 5.15 L/min  Cardiac Index (Fick) 2.9   Aortic valve mean gradient: 31 mmHg Aortic valve peak-to-peak gradient: 36 mmHg Aortic valve area (Gorlin): 0.9 cm^2  Coronary angiography: Coronary dominance: right  Left mainstem: No angiographic CAD  Left anterior descending (LAD): No angiographic CAD  Left circumflex (LCx): No angiographic CAD  Right coronary artery (RCA): No angiographic CAD  Left ventriculography: Not done  Final Conclusions:  No angiographic CAD.  Normal left and right heart filling pressures.  Aortic valve area (0.9 cm^2 by cath, 0.8 cm^2 by echo).  Mean gradient by cath 31 mmHg (36 peak-to-peak), mean gradient by echo 44 mmHg.  I think that this patient has severe aortic stenosis that is symptomatic.  He has symptoms of lightheadedness and dyspnea that have  been progressive over the last year.   Recommendations: He will followup with CVTS.  MRA chest showed no aortic aneurysm (done because of bicuspid valve).  He has recently been found to have Fe-deficiency anemia.  He will need GI workup and has an appointment to see a gastroenterologist in [place].    [name] 01/22/2011, 11:59 AM
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
## 2011-01-22 NOTE — Interval H&P Note (Signed)
History and Physical Interval Note:   01/22/2011   11:22 AM   Wayne Guzman  has presented today for surgery, with the diagnosis of as  The various methods of treatment have been discussed with the patient and family. After consideration of risks, benefits and other options for treatment, the patient has consented to  Procedure(s): LEFT AND RIGHT HEART CATHETERIZATION WITH CORONARY ANGIOGRAM as a surgical intervention .  The patients' history has been reviewed, patient examined, no change in status, stable for surgery.  I have reviewed the patients' chart and labs.  Questions were answered to the patient's satisfaction.     [name]  MD

## 2011-01-25 ENCOUNTER — Ambulatory Visit: Payer: Medicaid Other | Admitting: Gastroenterology

## 2011-01-26 ENCOUNTER — Encounter (HOSPITAL_COMMUNITY): Payer: Self-pay

## 2011-02-15 ENCOUNTER — Other Ambulatory Visit: Payer: Self-pay | Admitting: Thoracic Surgery (Cardiothoracic Vascular Surgery)

## 2011-02-15 ENCOUNTER — Encounter: Payer: Self-pay | Admitting: Thoracic Surgery (Cardiothoracic Vascular Surgery)

## 2011-02-15 ENCOUNTER — Ambulatory Visit (INDEPENDENT_AMBULATORY_CARE_PROVIDER_SITE_OTHER): Payer: Medicaid Other | Admitting: Thoracic Surgery (Cardiothoracic Vascular Surgery)

## 2011-02-15 VITALS — BP 104/70 | HR 72 | Resp 16 | Ht 69.5 in | Wt 145.0 lb

## 2011-02-15 DIAGNOSIS — I359 Nonrheumatic aortic valve disorder, unspecified: Secondary | ICD-10-CM

## 2011-02-15 DIAGNOSIS — I7409 Other arterial embolism and thrombosis of abdominal aorta: Secondary | ICD-10-CM

## 2011-02-15 DIAGNOSIS — I350 Nonrheumatic aortic (valve) stenosis: Secondary | ICD-10-CM

## 2011-02-15 NOTE — Progress Notes (Signed)
[address]            [place] [address]          [phone]     CARDIOTHORACIC SURGERY OFFICE NOTE  Referring Provider is [name], MD PCP is Evelene Croon, MD   HPI:  Patient returns for follow-up of aortic stenosis.  He was originally seen in consultation on November 12 and a full note was generated at that time. Since then he remains clinically stable. He underwent left and right heart catheterization as well as cardiac MRA of the chest. He has been scheduled to see a gastroenterologist in consultation because of severe iron deficient anemia. He has not yet seen the gastroenterologist but is appointment is scheduled for later this afternoon. His only complaint remains exertional shortness of breath as well as intermittent dizzy spells. He also has persistent pain in his right hand dating back to an injury he sustained several months ago when he slammed his hand in a door.   Current Outpatient Prescriptions  Medication Sig Dispense Refill  . ALPRAZolam (XANAX) 0.5 MG tablet Take 0.5 mg by mouth daily as needed. For anxiety      . aspirin 81 MG EC tablet Take 81 mg by mouth daily.        . busPIRone (BUSPAR) 10 MG tablet Take 10 mg by mouth 2 (two) times daily.        . cholecalciferol (VITAMIN D) 1000 UNITS tablet Take 1,000 Units by mouth daily.        . clonazePAM (KLONOPIN) 1 MG tablet Take 1 mg by mouth 2 (two) times daily as needed. For anxiety      . divalproex (DEPAKOTE) 500 MG EC tablet Take 1,500 mg by mouth daily.       Marland Kitchen esomeprazole (NEXIUM) 40 MG capsule Take 40 mg by mouth daily.        . Fluticasone-Salmeterol (ADVAIR DISKUS) 250-50 MCG/DOSE AEPB Inhale 2 puffs into the lungs daily.       . metoprolol (TOPROL XL) 50 MG 24 hr tablet Take 1 tablet (50 mg total) by mouth daily.  30 tablet  11  . OLANZapine (ZYPREXA) 10 MG tablet Take 10 mg by mouth 2 (two) times daily.       . roflumilast (DALIRESP) 500 MCG TABS tablet Take  500 mcg by mouth daily.        Marland Kitchen tiotropium (SPIRIVA) 18 MCG inhalation capsule Place 18 mcg into inhaler and inhale daily.       . traMADol (ULTRAM) 50 MG tablet Take 50 mg by mouth every 6 (six) hours as needed. For pain. Maximum dose= 8 tablets per day           Physical Exam:   BP 104/70  Pulse 72  Resp 16  Ht 5' 9.5" (1.765 m)  Wt 145 lb (65.772 kg)  BMI 21.11 kg/m2  SpO2 98%  Unchanged from previous exam. Breath sounds are clear to auscultation and symmetrical bilaterally. Cardiovascular exam is notable for a prominent systolic murmur. The abdomen is soft and nontender. Extremities are warm and well-perfused. There is no lower extremity edema. Pulses are diminished but palpable in the groin.  Diagnostic Tests:  Left and right heart catheterization performed 01/22/2011 is reviewed. This demonstrates normal coronary artery anatomy with no significant coronary artery disease. Pulmonary artery pressures measured 25/10 with pulmonary A. wedge pressure  9. The peak to peak and mean gradients across the aortic valve measured 36 and 31 mm mercury respectively. The calculated aortic valve area was 0.9 cm.  Cardiac output was 5.15 L per minute corresponding to a cardiac index of 2.9.  MRA of the chest performed 01/19/2011 is reviewed. There is no sign of aneurysmal enlargement of the a setting thoracic aorta.  The maximum transverse diameter was 3.5 cm. The aortic arch and descending thoracic aorta appeared normal. There was functionally bicuspid aortic valve.   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 The patient has no significant coronary artery disease.  The thoracic aorta appears normal with only mild enlargement of the a setting thoracic aorta, maximum transverse diameter 3.5 cm. I agree that the patient would benefit from elective aortic valve replacement. He might be  reasonable candidate for minimally invasive approach for surgery. The patient has severe iron deficient anemia and plans to see a gastroenterologist later today for consultation. I agree that both upper and lower endoscopy would be helpful to rule out serious potential sources of chronic blood loss. Results may impact our decision as to whether or not to replace his valve using a mechanical prosthesis or a bioprosthetic tissue valve.  Plan:  We will obtain CT angiogram of the abdomen and pelvis to rule out significant aortoiliac occlusive disease that might affect our ability to use minimally invasive approach for surgery. We will await for results of endoscopy to be performed in [facility] because of known chronic iron deficient anemia. The patient desires to wait until after the first of the year to consider elective surgery. We will see him back in January for further followup. We again discussed options with respect to surgery including whether or not to replace his valve using a mechanical prosthesis or a bioprosthetic tissue valve. All of his questions been addressed.    Salvatore Decent. Cornelius Moras, MD

## 2011-03-02 ENCOUNTER — Emergency Department: Payer: Self-pay | Admitting: Emergency Medicine

## 2011-03-20 ENCOUNTER — Emergency Department: Payer: Self-pay | Admitting: Emergency Medicine

## 2011-03-20 LAB — CBC
MCH: 27.6 pg (ref 26.0–34.0)
MCHC: 33.1 g/dL (ref 32.0–36.0)
Platelet: 249 10*3/uL (ref 150–440)

## 2011-03-20 LAB — COMPREHENSIVE METABOLIC PANEL
Albumin: 4.2 g/dL (ref 3.4–5.0)
Alkaline Phosphatase: 57 U/L (ref 50–136)
Anion Gap: 10 (ref 7–16)
Calcium, Total: 9.4 mg/dL (ref 8.5–10.1)
Chloride: 100 mmol/L (ref 98–107)
Potassium: 4.3 mmol/L (ref 3.5–5.1)
SGOT(AST): 28 U/L (ref 15–37)
Total Protein: 8.1 g/dL (ref 6.4–8.2)

## 2011-03-20 LAB — TSH: Thyroid Stimulating Horm: 0.97 u[IU]/mL

## 2011-03-20 LAB — PRO B NATRIURETIC PEPTIDE: B-Type Natriuretic Peptide: 639 pg/mL — ABNORMAL HIGH (ref 0–125)

## 2011-03-20 LAB — CK TOTAL AND CKMB (NOT AT ARMC): CK, Total: 115 U/L (ref 35–232)

## 2011-03-20 LAB — MAGNESIUM: Magnesium: 1.7 mg/dL — ABNORMAL LOW

## 2011-04-06 ENCOUNTER — Other Ambulatory Visit: Payer: Self-pay | Admitting: Thoracic Surgery (Cardiothoracic Vascular Surgery)

## 2011-04-08 ENCOUNTER — Inpatient Hospital Stay: Payer: Self-pay | Admitting: Internal Medicine

## 2011-04-08 LAB — COMPREHENSIVE METABOLIC PANEL
Albumin: 3.3 g/dL — ABNORMAL LOW (ref 3.4–5.0)
Anion Gap: 10 (ref 7–16)
BUN: 16 mg/dL (ref 7–18)
Bilirubin,Total: 0.3 mg/dL (ref 0.2–1.0)
Chloride: 98 mmol/L (ref 98–107)
Co2: 29 mmol/L (ref 21–32)
Creatinine: 0.78 mg/dL (ref 0.60–1.30)
EGFR (African American): >60
EGFR (Non-African Amer.): >60
Osmolality: 274 (ref 275–301)
Potassium: 3.5 mmol/L (ref 3.5–5.1)
SGPT (ALT): 81 U/L — ABNORMAL HIGH
Sodium: 137 mmol/L (ref 136–145)
Total Protein: 6.7 g/dL (ref 6.4–8.2)

## 2011-04-08 LAB — URINALYSIS, COMPLETE
Bilirubin,UR: NEGATIVE
Blood: NEGATIVE
Leukocyte Esterase: NEGATIVE
Nitrite: NEGATIVE
Ph: 7 (ref 4.5–8.0)
RBC,UR: <1 /HPF (ref 0–5)
Specific Gravity: 1.018 (ref 1.003–1.030)
Squamous Epithelial: 2

## 2011-04-08 LAB — CBC
HGB: 10.1 g/dL — ABNORMAL LOW (ref 13.0–18.0)
MCV: 84 fL (ref 80–100)
RBC: 3.78 10*6/uL — ABNORMAL LOW (ref 4.40–5.90)
RDW: 21 % — ABNORMAL HIGH (ref 11.5–14.5)
WBC: 11.1 10*3/uL — ABNORMAL HIGH (ref 3.8–10.6)

## 2011-04-08 LAB — TROPONIN I
Troponin-I: 0.13 ng/mL — ABNORMAL HIGH
Troponin-I: 0.12 ng/mL — ABNORMAL HIGH

## 2011-04-08 LAB — DRUG SCREEN, URINE
Cannabinoid 50 Ng, Ur ~~LOC~~: NEGATIVE (ref ?–50)
Cocaine Metabolite,Ur ~~LOC~~: NEGATIVE (ref ?–300)
MDMA (Ecstasy)Ur Screen: NEGATIVE (ref ?–500)
Methadone, Ur Screen: NEGATIVE (ref ?–300)
Phencyclidine (PCP) Ur S: NEGATIVE (ref ?–25)
Tricyclic, Ur Screen: NEGATIVE (ref ?–1000)

## 2011-04-08 LAB — ETHANOL: Ethanol: <3 mg/dL

## 2011-04-08 LAB — PROTIME-INR
INR: 1
Prothrombin Time: 13.5 secs (ref 11.5–14.7)

## 2011-04-08 LAB — RAPID INFLUENZA A&B ANTIGENS

## 2011-04-08 LAB — CK TOTAL AND CKMB (NOT AT ARMC)
CK, Total: 8386 U/L — ABNORMAL HIGH (ref 35–232)
CK-MB: 9.5 ng/mL — ABNORMAL HIGH (ref 0.5–3.6)

## 2011-04-08 LAB — APTT: Activated PTT: 31.1 secs (ref 23.6–35.9)

## 2011-04-09 DIAGNOSIS — R079 Chest pain, unspecified: Secondary | ICD-10-CM

## 2011-04-09 LAB — APTT: Activated PTT: 59.4 secs — ABNORMAL HIGH (ref 23.6–35.9)

## 2011-04-09 LAB — COMPREHENSIVE METABOLIC PANEL
Albumin: 2.7 g/dL — ABNORMAL LOW (ref 3.4–5.0)
Alkaline Phosphatase: 26 U/L — ABNORMAL LOW (ref 50–136)
BUN: 12 mg/dL (ref 7–18)
Calcium, Total: 8.1 mg/dL — ABNORMAL LOW (ref 8.5–10.1)
Glucose: 92 mg/dL (ref 65–99)
SGOT(AST): 248 U/L — ABNORMAL HIGH (ref 15–37)
SGPT (ALT): 60 U/L
Sodium: 143 mmol/L (ref 136–145)
Total Protein: 5.8 g/dL — ABNORMAL LOW (ref 6.4–8.2)

## 2011-04-09 LAB — CBC WITH DIFFERENTIAL/PLATELET
Basophil #: 0 10*3/uL (ref 0.0–0.1)
Eosinophil #: 0 10*3/uL (ref 0.0–0.7)
HGB: 9.6 g/dL — ABNORMAL LOW (ref 13.0–18.0)
Lymphocyte %: 24.9 %
MCH: 28.1 pg (ref 26.0–34.0)
MCHC: 33.5 g/dL (ref 32.0–36.0)
MCV: 84 fL (ref 80–100)
Monocyte #: 1.3 10*3/uL — ABNORMAL HIGH (ref 0.0–0.7)
Monocyte %: 12 %
Neutrophil #: 6.6 10*3/uL — ABNORMAL HIGH (ref 1.4–6.5)
Neutrophil %: 62.4 %
Platelet: 160 10*3/uL (ref 150–440)
RBC: 3.4 10*6/uL — ABNORMAL LOW (ref 4.40–5.90)
RDW: 20.7 % — ABNORMAL HIGH (ref 11.5–14.5)

## 2011-04-09 LAB — CK TOTAL AND CKMB (NOT AT ARMC): CK-MB: 5.3 ng/mL — ABNORMAL HIGH (ref 0.5–3.6)

## 2011-04-10 DIAGNOSIS — R079 Chest pain, unspecified: Secondary | ICD-10-CM

## 2011-04-10 LAB — LIPID PANEL
Ldl Cholesterol, Calc: 57 mg/dL (ref 0–100)
Triglycerides: 81 mg/dL (ref 0–200)

## 2011-04-10 LAB — BASIC METABOLIC PANEL
Anion Gap: 9 (ref 7–16)
Calcium, Total: 8.1 mg/dL — ABNORMAL LOW (ref 8.5–10.1)
Chloride: 102 mmol/L (ref 98–107)
Creatinine: 0.87 mg/dL (ref 0.60–1.30)
EGFR (African American): >60
EGFR (Non-African Amer.): >60
Glucose: 95 mg/dL (ref 65–99)
Osmolality: 273 (ref 275–301)
Sodium: 138 mmol/L (ref 136–145)

## 2011-04-10 LAB — CBC WITH DIFFERENTIAL/PLATELET
Basophil %: 0.2 %
Eosinophil #: 0 10*3/uL (ref 0.0–0.7)
Eosinophil %: 0.2 %
HGB: 10.1 g/dL — ABNORMAL LOW (ref 13.0–18.0)
Lymphocyte %: 11.7 %
MCH: 28.2 pg (ref 26.0–34.0)
MCHC: 33.2 g/dL (ref 32.0–36.0)
MCV: 85 fL (ref 80–100)
Neutrophil #: 8.1 10*3/uL — ABNORMAL HIGH (ref 1.4–6.5)
Neutrophil %: 78 %
RDW: 20.6 % — ABNORMAL HIGH (ref 11.5–14.5)

## 2011-04-11 ENCOUNTER — Encounter: Payer: Self-pay | Admitting: Physician Assistant

## 2011-04-12 ENCOUNTER — Telehealth: Payer: Self-pay | Admitting: Cardiology

## 2011-04-12 ENCOUNTER — Ambulatory Visit: Payer: Medicaid Other | Admitting: Thoracic Surgery (Cardiothoracic Vascular Surgery)

## 2011-04-12 ENCOUNTER — Inpatient Hospital Stay: Admission: RE | Admit: 2011-04-12 | Payer: Medicaid Other | Source: Ambulatory Visit

## 2011-04-12 LAB — BASIC METABOLIC PANEL
Calcium, Total: 8.5 mg/dL (ref 8.5–10.1)
Chloride: 103 mmol/L (ref 98–107)
Co2: 27 mmol/L (ref 21–32)
Creatinine: 0.85 mg/dL (ref 0.60–1.30)
Glucose: 103 mg/dL — ABNORMAL HIGH (ref 65–99)
Potassium: 3.7 mmol/L (ref 3.5–5.1)
Sodium: 142 mmol/L (ref 136–145)

## 2011-04-12 LAB — CBC WITH DIFFERENTIAL/PLATELET
Basophil %: 0.4 %
Eosinophil #: 0.1 10*3/uL (ref 0.0–0.7)
HCT: 28.2 % — ABNORMAL LOW (ref 40.0–52.0)
Lymphocyte #: 1.7 10*3/uL (ref 1.0–3.6)
MCH: 28.3 pg (ref 26.0–34.0)
MCHC: 33.2 g/dL (ref 32.0–36.0)
MCV: 85 fL (ref 80–100)
Monocyte #: 1.4 10*3/uL — ABNORMAL HIGH (ref 0.0–0.7)
Neutrophil #: 5.4 10*3/uL (ref 1.4–6.5)
Neutrophil %: 63.3 %
WBC: 8.6 10*3/uL (ref 3.8–10.6)

## 2011-04-12 LAB — CK: CK, Total: 782 U/L — ABNORMAL HIGH

## 2011-04-12 NOTE — Telephone Encounter (Signed)
New problem:  Patient was nos for CT angio this am

## 2011-04-12 NOTE — Telephone Encounter (Signed)
He is in the hospital in [place].  Test will need to be rescheduled.

## 2011-04-12 NOTE — Telephone Encounter (Signed)
04/12/11--i called pt to ask why he didn't show up for angiogram, but the phone # given is no longer in service--nt

## 2011-04-13 LAB — CULTURE, BLOOD (SINGLE)

## 2011-04-22 ENCOUNTER — Ambulatory Visit
Admission: RE | Admit: 2011-04-22 | Discharge: 2011-04-22 | Disposition: A | Discharge status: Home / Self Care | Payer: Medicaid Other | Source: Ambulatory Visit | Attending: Thoracic Surgery (Cardiothoracic Vascular Surgery) | Admitting: Thoracic Surgery (Cardiothoracic Vascular Surgery)

## 2011-04-22 DIAGNOSIS — I7409 Other arterial embolism and thrombosis of abdominal aorta: Secondary | ICD-10-CM

## 2011-04-22 MED ORDER — IOHEXOL 350 MG/ML SOLN
100.0000 mL | Freq: Once | INTRAVENOUS | Status: AC | PRN
  Administered 2011-04-22: 100 mL via INTRAVENOUS

## 2011-04-23 ENCOUNTER — Telehealth: Payer: Self-pay | Admitting: Cardiovascular Disease

## 2011-04-23 ENCOUNTER — Telehealth: Payer: Self-pay | Admitting: *Deleted

## 2011-04-23 NOTE — Telephone Encounter (Signed)
Pt called in today to the [facility] office stating he was supposed to have AVR on Monday w/Dr. Cornelius Moras but this was rsc and the hospital put him on a blood thinner and he is very nervous about this blood thinner now he does not know what to do. I advised pt though he sees Dr. Shirlee Latch in [facility] there is not a nurse on site right now and that he would have to s/w one of the nurses in [facility] for clarification about the medications. Pt gave me verbal understanding today about all of this and I gave him the [facility] card # [phone]. Danielle Rankin

## 2011-04-23 NOTE — Telephone Encounter (Signed)
Patient wanted to make sure he was take the right medication given in the hospital on D/C. He was scheduled to have AVR repair this coming Monday with Dr Cornelius Moras but he got  Pneumonia and was in the hospital for that. Patient was given Amiodarone 200 mg twice a day and started on Xarelto 20 mg once a day and also given NTG SL as needed for chest pain. Instructions given to pt of how to taken the medication and what it was given for. Patient verbalized understanding. Pt will call back if needed.

## 2011-04-23 NOTE — Telephone Encounter (Signed)
Pt was put on medication from Dr. Cornelius Moras and he has questions and concerns about it and wants to discuss with the nurse

## 2011-04-25 ENCOUNTER — Inpatient Hospital Stay: Payer: Self-pay | Admitting: Internal Medicine

## 2011-04-25 LAB — COMPREHENSIVE METABOLIC PANEL
BUN: 29 mg/dL — ABNORMAL HIGH (ref 7–18)
Bilirubin,Total: 0.3 mg/dL (ref 0.2–1.0)
Calcium, Total: 9.2 mg/dL (ref 8.5–10.1)
Chloride: 99 mmol/L (ref 98–107)
Co2: 26 mmol/L (ref 21–32)
Creatinine: 0.9 mg/dL (ref 0.60–1.30)
EGFR (African American): >60
EGFR (Non-African Amer.): >60
Osmolality: 280 (ref 275–301)

## 2011-04-25 LAB — CBC
HCT: 24.1 % — ABNORMAL LOW (ref 40.0–52.0)
MCHC: 32.7 g/dL (ref 32.0–36.0)
MCV: 83 fL (ref 80–100)
Platelet: 582 10*3/uL — ABNORMAL HIGH (ref 150–440)
RDW: 17.6 % — ABNORMAL HIGH (ref 11.5–14.5)
WBC: 12.5 10*3/uL — ABNORMAL HIGH (ref 3.8–10.6)

## 2011-04-25 LAB — APTT: Activated PTT: 36.5 secs — ABNORMAL HIGH (ref 23.6–35.9)

## 2011-04-25 LAB — PROTIME-INR: INR: 2

## 2011-04-26 ENCOUNTER — Telehealth: Payer: Self-pay | Admitting: Cardiology

## 2011-04-26 ENCOUNTER — Ambulatory Visit: Payer: Medicaid Other | Admitting: Thoracic Surgery (Cardiothoracic Vascular Surgery)

## 2011-04-26 LAB — BASIC METABOLIC PANEL
BUN: 26 mg/dL — ABNORMAL HIGH (ref 7–18)
Co2: 27 mmol/L (ref 21–32)
Creatinine: 0.9 mg/dL (ref 0.60–1.30)
EGFR (Non-African Amer.): >60
Glucose: 103 mg/dL — ABNORMAL HIGH (ref 65–99)
Osmolality: 281 (ref 275–301)
Potassium: 3.7 mmol/L (ref 3.5–5.1)
Sodium: 138 mmol/L (ref 136–145)

## 2011-04-26 LAB — CBC WITH DIFFERENTIAL/PLATELET
Basophil #: 0.1 10*3/uL (ref 0.0–0.1)
HCT: 24.7 % — ABNORMAL LOW (ref 40.0–52.0)
Lymphocyte #: 3.5 10*3/uL (ref 1.0–3.6)
MCH: 27 pg (ref 26.0–34.0)
MCV: 83 fL (ref 80–100)
Monocyte #: 0.9 10*3/uL — ABNORMAL HIGH (ref 0.0–0.7)
Monocyte %: 8.8 %
Neutrophil #: 5.8 10*3/uL (ref 1.4–6.5)
RDW: 17.4 % — ABNORMAL HIGH (ref 11.5–14.5)
WBC: 10.3 10*3/uL (ref 3.8–10.6)

## 2011-04-26 LAB — HEMOGLOBIN: HGB: 9.4 g/dL — ABNORMAL LOW (ref 13.0–18.0)

## 2011-04-26 NOTE — Telephone Encounter (Signed)
Cardiac Cath faxed to Dr.Elliott/Megan @ [phone] 04/26/11/KM

## 2011-04-27 LAB — CBC WITH DIFFERENTIAL/PLATELET
Basophil #: 0 10*3/uL (ref 0.0–0.1)
Basophil %: 0.4 %
Basophil %: 0.4 %
Eosinophil #: 0.1 10*3/uL (ref 0.0–0.7)
Eosinophil %: 0.3 %
Eosinophil %: 0.5 %
HCT: 25.6 % — ABNORMAL LOW (ref 40.0–52.0)
HCT: 23 % — ABNORMAL LOW (ref 40.0–52.0)
HGB: 8.4 g/dL — ABNORMAL LOW (ref 13.0–18.0)
HGB: 7.6 g/dL — ABNORMAL LOW (ref 13.0–18.0)
Lymphocyte #: 2.1 10*3/uL (ref 1.0–3.6)
Lymphocyte #: 2.3 10*3/uL (ref 1.0–3.6)
Lymphocyte %: 13.7 %
Lymphocyte %: 20.5 %
MCH: 27.7 pg (ref 26.0–34.0)
MCHC: 32.9 g/dL (ref 32.0–36.0)
MCV: 85 fL (ref 80–100)
MCV: 85 fL (ref 80–100)
Monocyte #: 1.5 10*3/uL — ABNORMAL HIGH (ref 0.0–0.7)
Monocyte %: 10.2 %
Neutrophil #: 11.3 10*3/uL — ABNORMAL HIGH (ref 1.4–6.5)
Neutrophil #: 7.9 10*3/uL — ABNORMAL HIGH (ref 1.4–6.5)
Platelet: 351 10*3/uL (ref 150–440)
RBC: 3.02 10*6/uL — ABNORMAL LOW (ref 4.40–5.90)
RDW: 15.9 % — ABNORMAL HIGH (ref 11.5–14.5)
RDW: 16.8 % — ABNORMAL HIGH (ref 11.5–14.5)
WBC: 15 10*3/uL — ABNORMAL HIGH (ref 3.8–10.6)

## 2011-04-28 LAB — CBC WITH DIFFERENTIAL/PLATELET
Basophil #: 0.1 10*3/uL (ref 0.0–0.1)
Basophil %: 0.4 %
Eosinophil %: 0 %
Lymphocyte #: 2 10*3/uL (ref 1.0–3.6)
Lymphocyte %: 14.1 %
MCV: 84 fL (ref 80–100)
Monocyte %: 6.6 %
Platelet: 327 10*3/uL (ref 150–440)
RBC: 3.16 10*6/uL — ABNORMAL LOW (ref 4.40–5.90)
RDW: 16.2 % — ABNORMAL HIGH (ref 11.5–14.5)
WBC: 13.9 10*3/uL — ABNORMAL HIGH (ref 3.8–10.6)

## 2011-04-28 LAB — URINALYSIS, COMPLETE
Bilirubin,UR: NEGATIVE
Blood: NEGATIVE
Glucose,UR: NEGATIVE mg/dL (ref 0–75)
Ph: 6 (ref 4.5–8.0)
Protein: NEGATIVE
Specific Gravity: 1.006 (ref 1.003–1.030)
WBC UR: 2 /HPF (ref 0–5)

## 2011-04-28 LAB — BASIC METABOLIC PANEL
Calcium, Total: 8.3 mg/dL — ABNORMAL LOW (ref 8.5–10.1)
Co2: 28 mmol/L (ref 21–32)
EGFR (African American): >60
EGFR (Non-African Amer.): >60
Glucose: 109 mg/dL — ABNORMAL HIGH (ref 65–99)
Osmolality: 287 (ref 275–301)
Potassium: 3 mmol/L — ABNORMAL LOW (ref 3.5–5.1)

## 2011-04-29 ENCOUNTER — Encounter: Payer: Medicaid Other | Admitting: Cardiovascular Disease

## 2011-04-29 LAB — COMPREHENSIVE METABOLIC PANEL
Albumin: 2.6 g/dL — ABNORMAL LOW (ref 3.4–5.0)
Alkaline Phosphatase: 30 U/L — ABNORMAL LOW (ref 50–136)
Anion Gap: 9 (ref 7–16)
BUN: 8 mg/dL (ref 7–18)
Co2: 26 mmol/L (ref 21–32)
Creatinine: 0.95 mg/dL (ref 0.60–1.30)
EGFR (Non-African Amer.): >60
Potassium: 3.5 mmol/L (ref 3.5–5.1)
SGOT(AST): 21 U/L (ref 15–37)
SGPT (ALT): 11 U/L — ABNORMAL LOW
Sodium: 142 mmol/L (ref 136–145)
Total Protein: 5.9 g/dL — ABNORMAL LOW (ref 6.4–8.2)

## 2011-04-29 LAB — TROPONIN I
Troponin-I: 0.4 ng/mL — ABNORMAL HIGH
Troponin-I: 0.52 ng/mL — ABNORMAL HIGH
Troponin-I: 0.67 ng/mL — ABNORMAL HIGH

## 2011-04-29 LAB — CBC WITH DIFFERENTIAL/PLATELET
Basophil %: 0.2 %
Eosinophil #: 0 10*3/uL (ref 0.0–0.7)
Eosinophil %: 0 %
HCT: 26.9 % — ABNORMAL LOW (ref 40.0–52.0)
HGB: 8.7 g/dL — ABNORMAL LOW (ref 13.0–18.0)
Lymphocyte %: 6.9 %
MCHC: 32.4 g/dL (ref 32.0–36.0)
MCV: 85 fL (ref 80–100)
Neutrophil #: 16 10*3/uL — ABNORMAL HIGH (ref 1.4–6.5)
Neutrophil %: 87.1 %
RDW: 17.3 % — ABNORMAL HIGH (ref 11.5–14.5)

## 2011-04-29 LAB — CK TOTAL AND CKMB (NOT AT ARMC)
CK, Total: 241 U/L — ABNORMAL HIGH (ref 35–232)
CK-MB: 2.2 ng/mL (ref 0.5–3.6)
CK-MB: 2.9 ng/mL (ref 0.5–3.6)
CK-MB: 3.7 ng/mL — ABNORMAL HIGH (ref 0.5–3.6)

## 2011-04-30 ENCOUNTER — Ambulatory Visit: Payer: Medicaid Other | Admitting: Thoracic Surgery (Cardiothoracic Vascular Surgery)

## 2011-04-30 DIAGNOSIS — I4891 Unspecified atrial fibrillation: Secondary | ICD-10-CM

## 2011-04-30 LAB — COMPREHENSIVE METABOLIC PANEL
Alkaline Phosphatase: 39 U/L — ABNORMAL LOW (ref 50–136)
BUN: 8 mg/dL (ref 7–18)
Bilirubin,Total: 0.4 mg/dL (ref 0.2–1.0)
Calcium, Total: 8.3 mg/dL — ABNORMAL LOW (ref 8.5–10.1)
Chloride: 104 mmol/L (ref 98–107)
Creatinine: 0.86 mg/dL (ref 0.60–1.30)
EGFR (African American): >60
Glucose: 108 mg/dL — ABNORMAL HIGH (ref 65–99)
SGOT(AST): 20 U/L (ref 15–37)
SGPT (ALT): 11 U/L — ABNORMAL LOW
Total Protein: 6.5 g/dL (ref 6.4–8.2)

## 2011-04-30 LAB — CBC WITH DIFFERENTIAL/PLATELET
Basophil #: 0 10*3/uL (ref 0.0–0.1)
Basophil %: 0 %
Eosinophil #: 0 10*3/uL (ref 0.0–0.7)
Eosinophil %: 0.1 %
HCT: 29 % — ABNORMAL LOW (ref 40.0–52.0)
HGB: 9.4 g/dL — ABNORMAL LOW (ref 13.0–18.0)
Lymphocyte %: 6.4 %
MCH: 27.5 pg (ref 26.0–34.0)
MCHC: 32.5 g/dL (ref 32.0–36.0)
MCV: 85 fL (ref 80–100)
Monocyte %: 8.2 %
Neutrophil #: 19.9 10*3/uL — ABNORMAL HIGH (ref 1.4–6.5)
Neutrophil %: 85.3 %
Platelet: 305 10*3/uL (ref 150–440)
RBC: 3.43 10*6/uL — ABNORMAL LOW (ref 4.40–5.90)
WBC: 23.4 10*3/uL — ABNORMAL HIGH (ref 3.8–10.6)

## 2011-04-30 LAB — HEPATIC FUNCTION PANEL A (ARMC)
Alkaline Phosphatase: 37 U/L — ABNORMAL LOW (ref 50–136)
Bilirubin,Total: 0.4 mg/dL (ref 0.2–1.0)
SGPT (ALT): 11 U/L — ABNORMAL LOW
Total Protein: 6.8 g/dL (ref 6.4–8.2)

## 2011-04-30 LAB — AMMONIA: Ammonia, Plasma: <25 mcmol/L (ref 11–32)

## 2011-05-01 DIAGNOSIS — R9431 Abnormal electrocardiogram [ECG] [EKG]: Secondary | ICD-10-CM

## 2011-05-01 LAB — BASIC METABOLIC PANEL
Anion Gap: 7 (ref 7–16)
Chloride: 100 mmol/L (ref 98–107)
Co2: 32 mmol/L (ref 21–32)
Creatinine: 0.77 mg/dL (ref 0.60–1.30)
EGFR (African American): >60
EGFR (Non-African Amer.): >60
Glucose: 160 mg/dL — ABNORMAL HIGH (ref 65–99)
Sodium: 139 mmol/L (ref 136–145)

## 2011-05-01 LAB — CBC WITH DIFFERENTIAL/PLATELET
Basophil #: 0 10*3/uL (ref 0.0–0.1)
Basophil %: 0 %
Eosinophil #: 0 10*3/uL (ref 0.0–0.7)
HGB: 8.7 g/dL — ABNORMAL LOW (ref 13.0–18.0)
Lymphocyte %: 5.2 %
MCH: 27.3 pg (ref 26.0–34.0)
MCHC: 32.6 g/dL (ref 32.0–36.0)
Monocyte #: 0.5 10*3/uL (ref 0.0–0.7)
Neutrophil #: 9.5 10*3/uL — ABNORMAL HIGH (ref 1.4–6.5)
Neutrophil %: 89.7 %
Platelet: 273 10*3/uL (ref 150–440)
WBC: 10.6 10*3/uL (ref 3.8–10.6)

## 2011-05-01 LAB — PHOSPHORUS
Phosphorus: 2.2 mg/dL — ABNORMAL LOW (ref 2.5–4.9)
Phosphorus: 3.6 mg/dL (ref 2.5–4.9)

## 2011-05-01 LAB — MAGNESIUM: Magnesium: 1.8 mg/dL

## 2011-05-02 LAB — BASIC METABOLIC PANEL
Anion Gap: 13 (ref 7–16)
BUN: 25 mg/dL — ABNORMAL HIGH (ref 7–18)
Co2: 30 mmol/L (ref 21–32)
Creatinine: 0.71 mg/dL (ref 0.60–1.30)
EGFR (African American): >60
EGFR (Non-African Amer.): >60
Glucose: 168 mg/dL — ABNORMAL HIGH (ref 65–99)
Sodium: 144 mmol/L (ref 136–145)

## 2011-05-02 LAB — CBC WITH DIFFERENTIAL/PLATELET
Basophil #: 0 10*3/uL (ref 0.0–0.1)
Eosinophil #: 0 10*3/uL (ref 0.0–0.7)
Eosinophil %: 0.1 %
HCT: 27.6 % — ABNORMAL LOW (ref 40.0–52.0)
HGB: 9 g/dL — ABNORMAL LOW (ref 13.0–18.0)
Lymphocyte %: 7.6 %
MCH: 27.3 pg (ref 26.0–34.0)
MCHC: 32.7 g/dL (ref 32.0–36.0)
Monocyte %: 6.5 %
Neutrophil %: 85.8 %
WBC: 8.6 10*3/uL (ref 3.8–10.6)

## 2011-05-02 LAB — POTASSIUM: Potassium: 4 mmol/L (ref 3.5–5.1)

## 2011-05-03 ENCOUNTER — Ambulatory Visit: Payer: Medicaid Other | Admitting: Thoracic Surgery (Cardiothoracic Vascular Surgery)

## 2011-05-03 LAB — BASIC METABOLIC PANEL
Calcium, Total: 8.2 mg/dL — ABNORMAL LOW (ref 8.5–10.1)
Chloride: 108 mmol/L — ABNORMAL HIGH (ref 98–107)
Co2: 31 mmol/L (ref 21–32)
Creatinine: 0.72 mg/dL (ref 0.60–1.30)
EGFR (African American): >60
EGFR (Non-African Amer.): >60
Potassium: 3.6 mmol/L (ref 3.5–5.1)
Sodium: 149 mmol/L — ABNORMAL HIGH (ref 136–145)

## 2011-05-03 LAB — CBC WITH DIFFERENTIAL/PLATELET
Basophil #: 0 10*3/uL (ref 0.0–0.1)
HCT: 26.6 % — ABNORMAL LOW (ref 40.0–52.0)
Lymphocyte #: 1 10*3/uL (ref 1.0–3.6)
MCHC: 33.2 g/dL (ref 32.0–36.0)
MCV: 83 fL (ref 80–100)
Monocyte %: 7.8 %
Neutrophil #: 11.3 10*3/uL — ABNORMAL HIGH (ref 1.4–6.5)
Neutrophil %: 84.8 %
RDW: 18.5 % — ABNORMAL HIGH (ref 11.5–14.5)
WBC: 13.4 10*3/uL — ABNORMAL HIGH (ref 3.8–10.6)

## 2011-05-04 LAB — CULTURE, BLOOD (SINGLE)

## 2011-05-05 ENCOUNTER — Encounter: Payer: Medicaid Other | Admitting: Cardiovascular Disease

## 2011-05-05 LAB — CBC WITH DIFFERENTIAL/PLATELET
Basophil #: 0 10*3/uL (ref 0.0–0.1)
Basophil %: 0 %
Eosinophil #: 0 10*3/uL (ref 0.0–0.7)
HGB: 8.5 g/dL — ABNORMAL LOW (ref 13.0–18.0)
Lymphocyte %: 3.9 %
MCH: 27.1 pg (ref 26.0–34.0)
MCHC: 32.7 g/dL (ref 32.0–36.0)
Monocyte #: 0.7 10*3/uL (ref 0.0–0.7)
Neutrophil %: 89.6 %
Platelet: 307 10*3/uL (ref 150–440)
RDW: 18.2 % — ABNORMAL HIGH (ref 11.5–14.5)
WBC: 11 10*3/uL — ABNORMAL HIGH (ref 3.8–10.6)

## 2011-05-07 ENCOUNTER — Encounter: Payer: Medicaid Other | Admitting: Cardiovascular Disease

## 2011-05-10 ENCOUNTER — Ambulatory Visit: Payer: Medicaid Other | Admitting: Thoracic Surgery (Cardiothoracic Vascular Surgery)

## 2011-05-14 ENCOUNTER — Ambulatory Visit: Payer: Medicaid Other | Admitting: Cardiology

## 2011-05-14 ENCOUNTER — Encounter: Payer: Self-pay | Admitting: Cardiology

## 2011-05-17 LAB — EXPECTORATED SPUTUM ASSESSMENT W GRAM STAIN, RFLX TO RESP C

## 2011-05-24 ENCOUNTER — Ambulatory Visit (INDEPENDENT_AMBULATORY_CARE_PROVIDER_SITE_OTHER): Payer: Medicaid Other | Admitting: Cardiology

## 2011-05-24 ENCOUNTER — Encounter: Payer: Self-pay | Admitting: Cardiology

## 2011-05-24 VITALS — BP 101/59 | HR 79 | Ht 69.5 in | Wt 130.0 lb

## 2011-05-24 DIAGNOSIS — K922 Gastrointestinal hemorrhage, unspecified: Secondary | ICD-10-CM | POA: Insufficient documentation

## 2011-05-24 DIAGNOSIS — I359 Nonrheumatic aortic valve disorder, unspecified: Secondary | ICD-10-CM

## 2011-05-24 DIAGNOSIS — I4891 Unspecified atrial fibrillation: Secondary | ICD-10-CM

## 2011-05-24 DIAGNOSIS — I771 Stricture of artery: Secondary | ICD-10-CM

## 2011-05-24 DIAGNOSIS — I10 Essential (primary) hypertension: Secondary | ICD-10-CM

## 2011-05-24 DIAGNOSIS — I6529 Occlusion and stenosis of unspecified carotid artery: Secondary | ICD-10-CM

## 2011-05-24 NOTE — Assessment & Plan Note (Signed)
Previous documented left subclavian artery stenosis, no definite steal phenomenon.

## 2011-05-24 NOTE — Assessment & Plan Note (Signed)
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

## 2011-05-24 NOTE — Progress Notes (Signed)
Clinical Summary Wayne Guzman is a medically complex 61 y.o.male with history outlined below, seen today in the [place] office as he is currently undergoing rehabilitation at [facility] in [place]. He is patient of our Scientist, research (physical sciences), has seen Dr. Mariah Milling and Dr. Shirlee Latch. Recent records from late February were reviewed including hospital admission at [facility] with pneumonia, atrial fibrillation, anemia in the setting of GI bleeding, VDRF, agitation. He was initiated on amiodarone, had been on Xarelto although this was discontinued in the setting of his GI bleeding.  Recent lab work reviewed with findings of mildly increased troponin in the setting of rapid atrial fibrillation, ALT 11, AST 12, sodium 145, potassium 3.6, BUN 8, creatinine 0.8. Not certain about the status of his GI workup. He reportedly had a colonoscopy, did not undergo capsule endoscopy. I do not have the details at hand.  Record review finds that he has been followed by Dr. Cornelius Moras (last seen 12/12) for discussion regarding management of bicuspid aortic valve with severe stenosis, possible elective aortic valve replacement via minimally invasive approach. This has been held up based on iron deficiency anemia and pending gastroenterology evaluation. It does not appear that he has had any followup since that time.  He is here with his brother today. Wayne Guzman states that he has been feeling gradually somewhat better, working with PT. They indicated that he might be in rehabilitation for perhaps another week, then back home. He reports no sense of palpitations. Has stable NYHA class 2-3 dyspnea on exertion, no chest pain. No orthopnea or peripheral edema.  No Known Allergies  Current Outpatient Prescriptions  Medication Sig Dispense Refill  . acetaminophen (TYLENOL) 650 MG CR tablet Take 650 mg by mouth every 4 (four) hours as needed.      . ALPRAZolam (XANAX) 1 MG tablet Take 1 mg by mouth at bedtime as needed.      Marland Kitchen amiodarone  (PACERONE) 200 MG tablet Take 200 mg by mouth 2 (two) times daily.      . busPIRone (BUSPAR) 15 MG tablet Take 15 mg by mouth 2 (two) times daily.      . clonazePAM (KLONOPIN) 1 MG tablet Take 1 mg by mouth 2 (two) times daily as needed. For anxiety      . divalproex (DEPAKOTE) 500 MG EC tablet Take 1,500 mg by mouth daily.       . Fluticasone-Salmeterol (ADVAIR DISKUS) 250-50 MCG/DOSE AEPB Inhale 2 puffs into the lungs daily.       Marland Kitchen gabapentin (NEURONTIN) 600 MG tablet Take 600 mg by mouth 4 (four) times daily.      . metoprolol succinate (TOPROL-XL) 25 MG 24 hr tablet Take 25 mg by mouth daily.      . metroNIDAZOLE (FLAGYL) 500 MG tablet Take 500 mg by mouth 3 (three) times daily.      Marland Kitchen OLANZapine (ZYPREXA) 10 MG tablet Take 10 mg by mouth 2 (two) times daily.       . pantoprazole (PROTONIX) 40 MG tablet Take 40 mg by mouth daily.      . roflumilast (DALIRESP) 500 MCG TABS tablet Take 500 mcg by mouth daily.        Marland Kitchen tiotropium (SPIRIVA) 18 MCG inhalation capsule Place 18 mcg into inhaler and inhale daily.       . traMADol (ULTRAM) 50 MG tablet Take 50 mg by mouth every 6 (six) hours as needed. For pain. Maximum dose= 8 tablets per day  Past Medical History  Diagnosis Date  . Bipolar disorder   . COPD (chronic obstructive pulmonary disease)     PFTs done in 11/09 show FVC of 108%, FEV! of 64%, and FEF25-25% of 30%. There was 23% improvment in the FEV1 with a bronchodilator.   . Essential hypertension, benign   . Chronic pain disorder   . Aortic stenosis     Appeared severe with a mean gradient of 42 mm of mercury and a dimensionless index to 0.24. The aorti valve itself was not well visualized.  . Bicuspid aortic valve     TEE in 11/09. Planimetered valve area was 1.1 cm sqaured witha a mean gradient 32 mmHg. No aortic coarctation. No aneurysm of thoracic aorta.  Marland Kitchen DJD (degenerative joint disease) of cervical spine   . DJD (degenerative joint disease) of lumbar spine   .  Seizures     Details unclear, 2 episodes last 5 months  . Atrial fibrillation     In setting of acute illness 2/13  . Pneumonia   . Anemia     GI bleed requiring transfusion.  . Internal hemorrhoids     Colonoscopy 2/13  . Reflux esophagitis     EGD 2/13    Past Surgical History  Procedure Date  . Nasal sinus surgery   . Knee surgery   . Carpal tunnel release     Family History  Problem Relation Age of Onset  . Coronary artery disease Father   . Aortic stenosis Neg Hx     Social History Wayne Guzman reports that he has been smoking Cigarettes.  He has never used smokeless tobacco. Wayne Guzman reports that he does not drink alcohol.  Review of Systems Stable appetite. Brother states that he has still had some confusion at times. Also reportedly had recent fever. No progressive cough, no hemoptysis. No syncope. Otherwise negative.  Physical Examination Filed Vitals:   05/24/11 1501  BP: 101/59  Pulse: 79   Chronically ill-appearing male seated in a wheelchair, no acute distress. HEENT: Conjunctiva and lids normal, oropharynx clear with poor dentition. Neck: Supple, no elevated JVP, left carotid bruit, no thyromegaly. Lungs: Diminished, rhonchi but no wheeze, nonlabored breathing at rest. Cardiac: Regular rate and rhythm, no S3, 3/6 systolic murmur, no pericardial rub. Abdomen: Soft, nontender, bowel sounds present, no guarding or rebound. Extremities: No pitting edema, distal pulses 1+. Skin: Warm and dry. Musculoskeletal: No kyphosis. Neuropsychiatric: Alert and oriented x3, affect grossly appropriate.   ECG Sinus rhythm with LVH, QTc 502 ms.   Problem List and Plan

## 2011-05-24 NOTE — Assessment & Plan Note (Signed)
Anemia with history of GI bleed requiring packed red cell transfusion at [place]. I do not have complete information. The patient's brother states that he had a colonoscopy, was supposed to have a capsule endoscopy but could not tolerate it. He will clearly need further GI evaluation to help assist with plans for aortic valve surgery.

## 2011-05-24 NOTE — Assessment & Plan Note (Signed)
Blood pressure is well-controlled today. 

## 2011-05-24 NOTE — Patient Instructions (Addendum)
Your physician recommends that you schedule a follow-up appointment in:  1 - Dr Cornelius Moras as soon as possible 2 - Follow up in [place] in 3 weeks  Your physician recommends that you return for lab work in: BMET and CBC prior to discharge from [facility] and fax to [facility] in [place] @ [phone], if he is discharged within the next 3 weeks.

## 2011-05-24 NOTE — Assessment & Plan Note (Signed)
The patient is in sinus rhythm today on amiodarone. He had problems with rapid rates in atrial fibrillation during setting of acute illness and hospitalization at [facility]. He had been on Xarelto also for a period of time, now discontinued. I do not plan to initiate any anticoagulant therapy at this point in light of history of GI bleeding and potentially an incomplete workup so far. He will clearly need followup for this going forward, and this has been scheduled with Dr. Mariah Milling in our [place] office.

## 2011-05-24 NOTE — Assessment & Plan Note (Signed)
Reported bicuspid aortic valve, symptomatic stenosis graded in the severe range based on prior evaluation. He has been followed through our [facility] clinic, also assessed by Dr. Cornelius Moras in December of 2012 regarding valve replacement. Mode and type of valve had not yet been determined with pending GI evaluation and additional imaging. Patient had complex comorbid illness with hospitalization at [facility] in the interim, now in rehabilitation through [facility] in [place]. My understanding is that he will be completing rehabilitation soon, and return to [place]. Plan at this point is to continue medical therapy and observation. We are scheduling a followup visit soon with Dr. Cornelius Moras to continue workup, and also plan to schedule a followup visit in the [facility] office with Dr. Mariah Milling.

## 2011-05-25 ENCOUNTER — Emergency Department (HOSPITAL_COMMUNITY): Payer: Medicaid Other

## 2011-05-25 ENCOUNTER — Inpatient Hospital Stay (HOSPITAL_COMMUNITY)
Admission: EM | Admit: 2011-05-25 | Discharge: 2011-05-31 | DRG: 194 | Disposition: A | Discharge status: Skilled Nursing Facility | Payer: Medicaid Other | Attending: Internal Medicine | Admitting: Internal Medicine

## 2011-05-25 ENCOUNTER — Encounter (HOSPITAL_COMMUNITY): Payer: Self-pay

## 2011-05-25 ENCOUNTER — Other Ambulatory Visit: Payer: Self-pay

## 2011-05-25 DIAGNOSIS — R1310 Dysphagia, unspecified: Secondary | ICD-10-CM | POA: Diagnosis present

## 2011-05-25 DIAGNOSIS — I4891 Unspecified atrial fibrillation: Secondary | ICD-10-CM | POA: Diagnosis present

## 2011-05-25 DIAGNOSIS — J189 Pneumonia, unspecified organism: Principal | ICD-10-CM | POA: Diagnosis present

## 2011-05-25 DIAGNOSIS — F319 Bipolar disorder, unspecified: Secondary | ICD-10-CM | POA: Diagnosis present

## 2011-05-25 DIAGNOSIS — J449 Chronic obstructive pulmonary disease, unspecified: Secondary | ICD-10-CM | POA: Diagnosis present

## 2011-05-25 DIAGNOSIS — I359 Nonrheumatic aortic valve disorder, unspecified: Secondary | ICD-10-CM | POA: Diagnosis present

## 2011-05-25 DIAGNOSIS — J4489 Other specified chronic obstructive pulmonary disease: Secondary | ICD-10-CM | POA: Diagnosis present

## 2011-05-25 DIAGNOSIS — R195 Other fecal abnormalities: Secondary | ICD-10-CM | POA: Diagnosis present

## 2011-05-25 DIAGNOSIS — Z7901 Long term (current) use of anticoagulants: Secondary | ICD-10-CM

## 2011-05-25 DIAGNOSIS — F17200 Nicotine dependence, unspecified, uncomplicated: Secondary | ICD-10-CM | POA: Diagnosis present

## 2011-05-25 DIAGNOSIS — D649 Anemia, unspecified: Secondary | ICD-10-CM

## 2011-05-25 DIAGNOSIS — R0600 Dyspnea, unspecified: Secondary | ICD-10-CM

## 2011-05-25 DIAGNOSIS — D500 Iron deficiency anemia secondary to blood loss (chronic): Secondary | ICD-10-CM | POA: Diagnosis present

## 2011-05-25 DIAGNOSIS — K922 Gastrointestinal hemorrhage, unspecified: Secondary | ICD-10-CM | POA: Diagnosis present

## 2011-05-25 DIAGNOSIS — I10 Essential (primary) hypertension: Secondary | ICD-10-CM | POA: Diagnosis present

## 2011-05-25 LAB — TROPONIN I: Troponin I: <0.3 ng/mL (ref <0.30)

## 2011-05-25 LAB — CBC
MCV: 84 fL (ref 78.0–100.0)
Platelets: 286 10*3/uL (ref 150–400)
RBC: 2.37 MIL/uL — ABNORMAL LOW (ref 4.22–5.81)
RDW: 21.5 % — ABNORMAL HIGH (ref 11.5–15.5)
WBC: 12.5 10*3/uL — ABNORMAL HIGH (ref 4.0–10.5)

## 2011-05-25 LAB — DIFFERENTIAL
Basophils Relative: 0 % (ref 0–1)
Eosinophils Absolute: 0 10*3/uL (ref 0.0–0.7)
Lymphs Abs: 1.9 10*3/uL (ref 0.7–4.0)
Neutrophils Relative %: 74 % (ref 43–77)

## 2011-05-25 LAB — BASIC METABOLIC PANEL
CO2: 30 mEq/L (ref 19–32)
Chloride: 102 mEq/L (ref 96–112)
Creatinine, Ser: 0.71 mg/dL (ref 0.50–1.35)
GFR calc Af Amer: >90 mL/min (ref >90)
Potassium: 4.2 mEq/L (ref 3.5–5.1)
Sodium: 139 mEq/L (ref 135–145)

## 2011-05-25 LAB — PRO B NATRIURETIC PEPTIDE: Pro B Natriuretic peptide (BNP): 1380 pg/mL — ABNORMAL HIGH (ref 0–125)

## 2011-05-25 MED ORDER — PIPERACILLIN-TAZOBACTAM 3.375 G IVPB
3.3750 g | Freq: Once | INTRAVENOUS | Status: AC
  Administered 2011-05-25: 3.375 g via INTRAVENOUS
  Filled 2011-05-25: qty 50

## 2011-05-25 MED ORDER — IPRATROPIUM BROMIDE 0.02 % IN SOLN
0.5000 mg | Freq: Once | RESPIRATORY_TRACT | Status: AC
  Administered 2011-05-25: 0.5 mg via RESPIRATORY_TRACT

## 2011-05-25 MED ORDER — VANCOMYCIN HCL IN DEXTROSE 1-5 GM/200ML-% IV SOLN
1000.0000 mg | Freq: Once | INTRAVENOUS | Status: AC
  Administered 2011-05-25: 1000 mg via INTRAVENOUS
  Filled 2011-05-25: qty 200

## 2011-05-25 MED ORDER — SODIUM CHLORIDE 0.9 % IV SOLN
Freq: Once | INTRAVENOUS | Status: AC
  Administered 2011-05-25: 21:00:00 via INTRAVENOUS

## 2011-05-25 MED ORDER — IPRATROPIUM BROMIDE 0.02 % IN SOLN
0.5000 mg | Freq: Once | RESPIRATORY_TRACT | Status: DC
  Filled 2011-05-25 (×2): qty 2.5

## 2011-05-25 MED ORDER — ALBUTEROL SULFATE (5 MG/ML) 0.5% IN NEBU
5.0000 mg | INHALATION_SOLUTION | Freq: Once | RESPIRATORY_TRACT | Status: DC
  Filled 2011-05-25: qty 1

## 2011-05-25 NOTE — ED Notes (Signed)
Patient to ct

## 2011-05-25 NOTE — ED Notes (Signed)
Dr. Rito Ehrlich in with patient

## 2011-05-25 NOTE — H&P (Signed)
Wayne Guzman is an 61 y.o. male.    PCP: Evelene Croon, MD, MD   Chief Complaint: Weakness, and cough for 3 days  HPI: This is a 61 year old, Caucasian male, with a past medical history of aortic stenosis, hypertension, atrial fibrillation, but not on anticoagulation, bipolar disorder, and COPD, who lives at a local nursing home. Patient was recently discharged from [facility] about 9-10 days ago. He apparently, had pneumonia, and GI bleeding. He, apparently, underwent colonoscopy and upper endoscopy. No reason was found for his bleeding.  Patient tells me for the last few days  he's been feeling very weak. Has had coughing with yellowish expectoration. Has had shortness of breath. Denies any leg swelling. Has minimal amount of chest pain, especially when he has to take a deep breath. Otherwise, none. Denies any nausea, vomiting. Has had nonspecific abdominal pain for last 4 weeks, but none currently. Denies any diarrhea. He tells me that he had a normal bowel movement yesterday and did not notice any blood. Denies any black colored stools. Has lost some weight. He is unable to tell me, if he's had a capsule study done recently. Denies any fever or chills.  He tells me has a history of aortic stenosis, and is being considered for surgical intervention.   Home Medications: Prior to Admission medications   Medication Sig Start Date End Date Taking? Authorizing Provider  acetaminophen (TYLENOL) 650 MG CR tablet Take 650 mg by mouth every 4 (four) hours as needed. For pain   Yes Historical Provider, MD  busPIRone (BUSPAR) 15 MG tablet Take 15 mg by mouth 2 (two) times daily.   Yes Historical Provider, MD  clonazePAM (KLONOPIN) 1 MG tablet Take 1 mg by mouth 2 (two) times daily as needed. For anxiety   Yes Historical Provider, MD  divalproex (DEPAKOTE) 500 MG EC tablet Take 1,500 mg by mouth daily.    Yes Historical Provider, MD  esomeprazole (NEXIUM) 40 MG capsule Take 40  mg by mouth daily before breakfast.   Yes Historical Provider, MD  Fluticasone-Salmeterol (ADVAIR DISKUS) 250-50 MCG/DOSE AEPB Inhale 2 puffs into the lungs daily.    Yes Historical Provider, MD  gabapentin (NEURONTIN) 600 MG tablet Take 600 mg by mouth 4 (four) times daily.   Yes Historical Provider, MD  HYDROcodone-acetaminophen (LORTAB 7.5) 7.5-500 MG per tablet Take 1 tablet by mouth every 6 (six) hours as needed. For pain   Yes Historical Provider, MD  metoprolol tartrate (LOPRESSOR) 25 MG tablet Take 25 mg by mouth 2 (two) times daily.   Yes Historical Provider, MD  OLANZapine (ZYPREXA) 10 MG tablet Take 25 mg by mouth at bedtime.    Yes Historical Provider, MD  tiotropium (SPIRIVA) 18 MCG inhalation capsule Place 18 mcg into inhaler and inhale daily.    Yes Historical Provider, MD  amiodarone (PACERONE) 200 MG tablet Take 200 mg by mouth 2 (two) times daily.    Historical Provider, MD  metroNIDAZOLE (FLAGYL) 500 MG tablet Take 500 mg by mouth 3 (three) times daily.    Historical Provider, MD    Allergies:  Allergies  Allergen Reactions  . Albuterol Anaphylaxis    Past Medical History: Past Medical History  Diagnosis Date  . Bipolar disorder   . COPD (chronic obstructive pulmonary disease)     PFTs done in 11/09 show FVC of 108%, FEV! of 64%, and FEF25-25% of 30%. There was 23% improvment in the FEV1 with a bronchodilator.   . Essential hypertension,  benign   . Chronic pain disorder   . Aortic stenosis     Appeared severe with a mean gradient of 42 mm of mercury and a dimensionless index to 0.24. The aorti valve itself was not well visualized.  . Bicuspid aortic valve     TEE in 11/09. Planimetered valve area was 1.1 cm sqaured witha a mean gradient 32 mmHg. No aortic coarctation. No aneurysm of thoracic aorta.  Marland Kitchen DJD (degenerative joint disease) of cervical spine   . DJD (degenerative joint disease) of lumbar spine   . Seizures     Details unclear, 2 episodes last 5 months  .  Atrial fibrillation     In setting of acute illness 2/13  . Pneumonia   . Anemia     GI bleed requiring transfusion.  . Internal hemorrhoids     Colonoscopy 2/13  . Reflux esophagitis     EGD 2/13    Past Surgical History  Procedure Date  . Nasal sinus surgery   . Knee surgery   . Carpal tunnel release     Social History:  reports that he has been smoking Cigarettes.  He has never used smokeless tobacco. He reports that he does not drink alcohol or use illicit drugs.  Family History:  Family History  Problem Relation Age of Onset  . Coronary artery disease Father   . Aortic stenosis Neg Hx     Review of Systems - History obtained from the patient General ROS: positive for  - fatigue Psychological ROS: negative Ophthalmic ROS: negative ENT ROS: negative Allergy and Immunology ROS: negative Hematological and Lymphatic ROS: negative Endocrine ROS: negative Respiratory ROS: as in hpi Cardiovascular ROS: as in hpi Gastrointestinal ROS: negative Genito-Urinary ROS: negative Musculoskeletal ROS: negative Neurological ROS: negative Dermatological ROS: negative  Physical Examination Blood pressure 108/59, pulse 73, temperature 98.6 F (37 C), temperature source Oral, resp. rate 22, height 5\' 9"  (1.753 m), weight 63.504 kg (140 lb), SpO2 93.00%.  General appearance: alert, cooperative, appears stated age and no distress Head: Normocephalic, without obvious abnormality, atraumatic Eyes: conjunctivae/corneas clear. PERRL, EOM's intact. Pallor was present Throat: lips, mucosa, and tongue normal; teeth and gums normal Neck: no adenopathy, no carotid bruit, no JVD, supple, symmetrical, trachea midline and thyroid not enlarged, symmetric, no tenderness/mass/nodules Resp: Decreased air entry at the bases with a few crackles bilaterally. More so on the left side. No wheezing is present. Cardio: regular rate and rhythm, S1, S2 normal, no murmur, click, rub or gallop GI: soft,  non-tender; bowel sounds normal; no masses,  no organomegaly. rectal exam showed brown stool, heme positive. Extremities: extremities normal, atraumatic, no cyanosis or edema Pulses: 2+ and symmetric Skin: Skin color, texture, turgor normal. No rashes or lesions Lymph nodes: Cervical, supraclavicular, and axillary nodes normal. Neurologic: Grossly normal  Laboratory Data: Results for orders placed during the hospital encounter of 05/25/11 (from the past 48 hour(s))  CBC     Status: Abnormal   Collection Time   05/25/11  7:54 PM      Component Value Range Comment   WBC 12.5 (*) 4.0 - 10.5 (K/uL)    RBC 2.37 (*) 4.22 - 5.81 (MIL/uL)    Hemoglobin 6.2 (*) 13.0 - 17.0 (g/dL)    HCT 16.1 (*) 09.6 - 52.0 (%)    MCV 84.0  78.0 - 100.0 (fL)    MCH 26.2  26.0 - 34.0 (pg)    MCHC 31.2  30.0 - 36.0 (g/dL)    RDW  21.5 (*) 11.5 - 15.5 (%)    Platelets 286  150 - 400 (K/uL)   BASIC METABOLIC PANEL     Status: Abnormal   Collection Time   05/25/11  7:54 PM      Component Value Range Comment   Sodium 139  135 - 145 (mEq/L)    Potassium 4.2  3.5 - 5.1 (mEq/L)    Chloride 102  96 - 112 (mEq/L)    CO2 30  19 - 32 (mEq/L)    Glucose, Bld 122 (*) 70 - 99 (mg/dL)    BUN 19  6 - 23 (mg/dL)    Creatinine, Ser 4.54  0.50 - 1.35 (mg/dL)    Calcium 8.7  8.4 - 10.5 (mg/dL)    GFR calc non Af Amer >90  >90 (mL/min)    GFR calc Af Amer >90  >90 (mL/min)   PRO B NATRIURETIC PEPTIDE     Status: Abnormal   Collection Time   05/25/11  7:54 PM      Component Value Range Comment   Pro B Natriuretic peptide (BNP) 1380.0 (*) 0 - 125 (pg/mL)   TROPONIN I     Status: Normal   Collection Time   05/25/11  7:54 PM      Component Value Range Comment   Troponin I <0.30  <0.30 (ng/mL)   DIFFERENTIAL     Status: Abnormal   Collection Time   05/25/11  9:26 PM      Component Value Range Comment   Neutrophils Relative 74  43 - 77 (%)    Neutro Abs 9.4 (*) 1.7 - 7.7 (K/uL)    Lymphocytes Relative 15  12 - 46 (%)     Lymphs Abs 1.9  0.7 - 4.0 (K/uL)    Monocytes Relative 11  3 - 12 (%)    Monocytes Absolute 1.4 (*) 0.1 - 1.0 (K/uL)    Eosinophils Relative 0  0 - 5 (%)    Eosinophils Absolute 0.0  0.0 - 0.7 (K/uL)    Basophils Relative 0  0 - 1 (%)    Basophils Absolute 0.0  0.0 - 0.1 (K/uL)   CULTURE, BLOOD (ROUTINE X 2)     Status: Normal (Preliminary result)   Collection Time   05/25/11  9:31 PM      Component Value Range Comment   Specimen Description BLOOD LEFT FOREARM      Special Requests BOTTLES DRAWN AEROBIC AND ANAEROBIC 6CC      Culture PENDING      Report Status PENDING     CULTURE, BLOOD (ROUTINE X 2)     Status: Normal (Preliminary result)   Collection Time   05/25/11  9:33 PM      Component Value Range Comment   Specimen Description BLOOD RIGHT FOREARM      Special Requests BOTTLES DRAWN AEROBIC AND ANAEROBIC 5CC      Culture PENDING      Report Status PENDING       Radiology Reports: Dg Chest Portable 1 View  05/25/2011  *RADIOLOGY REPORT*  Clinical Data: Shortness of breath; emesis.  History of smoking.  PORTABLE CHEST - 1 VIEW  Comparison: Chest radiograph performed 10/14/2008  Findings: The lungs are well-aerated.  Vascular congestion is noted.  Diffusely increased interstitial markings are noted, more prominent at the left lung base.  The appearance raises question for mild interstitial edema, though the presence of more focal left basilar opacity raises concern for interstitial lung disease. Alternatively,  pneumonia might have a similar appearance.  There is no evidence of pleural effusion or pneumothorax.  The cardiomediastinal silhouette is within normal limits.  No acute osseous abnormalities are seen.  IMPRESSION: Diffusely increased interstitial markings, more prominent at the left lung base, with underlying vascular congestion.  Though this could reflect interstitial edema, the appearance raises question for interstitial lung disease.  Alternatively, left basilar pneumonia might  have a similar appearance.  Original Report Authenticated By: Tonia Ghent, M.D.    Electrocardiogram: EKG shows sinus rhythm at 80 beats per minute. Normal axis. Normal. Intervals. No Q waves. No concerning ST or T-wave changes. Criteria for LVH is present.  Assessment/Plan  Principal Problem:  *HCAP (healthcare-associated pneumonia) Active Problems:  BIPOLAR DISORDER UNSPECIFIED  HYPERTENSION, UNSPECIFIED  Aortic stenosis  Atrial fibrillation  GI bleeding  Anemia   #1 healthcare associated pneumonia. He'll be treated with broad-spectrum antibiotics. Oxygen will be provided.  #2 severe anemia possibly due to blood loss, acute on chronic: He has heme positive, stools. Apparently, he was anemic and had a questionable GI bleeding recently at [facility]. We will get records of that hospital stay. We will type and cross and transfuse him 2 units of blood as I believe he could be experiencing symptoms from his anemia. We will consult gastroenterology to see him.  #3 history of aortic stenosis. He is followed by cardiology. Plan is for surgical intervention in the future if his GI bleeding issues resolve.  #4 history of  Atrial fibrillation: He used to be on anticoagulation, but is no longer on it. He is on amiodarone, which we will continue. He be monitored on telemetry. Echo from September of 2012 showed EF of 50% with the diastolic dysfunction. Continue with low-dose beta blockers.  #5 history of bipolar disorder. Continue with the psychotropic agents.  He is getting rehabilitation at the nursing facility and so, we will continue physical therapy and occupational therapy.  Is a full code.  DVT, prophylaxis with SCDs.  Further management decisions will depend on results of further testing and patient's response to treatment.   [facility]  [facility] Pager [phone]  05/25/2011, 11:57 PM
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
## 2011-05-25 NOTE — Progress Notes (Signed)
Albuterol/atrovent treatment pulled and mixed then pt states he can't take albuterol due to it increases  His hr.

## 2011-05-25 NOTE — ED Notes (Signed)
hemmocult positive per dr Rito Ehrlich

## 2011-05-25 NOTE — ED Notes (Signed)
Patient states he was eating pudding and it came out of his nostrils

## 2011-05-25 NOTE — ED Notes (Signed)
Patient resting with eyes closed.   No distress noted.

## 2011-05-25 NOTE — ED Notes (Signed)
CRITICAL VALUE ALERT  Critical value received:  Hem 6.2  Date of notification:  05/25/11 Time of notification:  2134  Critical value read back:yes  Nurse who received alert:  P.Genevie Elman,rn  MD notified (1st page):  cook  Time of first page:  2143  MD notified (2nd page):  Time of second page:  Responding MD:  cook  Time MD responded:  2143

## 2011-05-26 ENCOUNTER — Encounter (HOSPITAL_COMMUNITY): Payer: Self-pay

## 2011-05-26 LAB — CBC
Hemoglobin: 9.4 g/dL — ABNORMAL LOW (ref 13.0–17.0)
MCV: 83.5 fL (ref 78.0–100.0)
Platelets: 279 10*3/uL (ref 150–400)
RBC: 3.34 MIL/uL — ABNORMAL LOW (ref 4.22–5.81)
WBC: 16.4 10*3/uL — ABNORMAL HIGH (ref 4.0–10.5)

## 2011-05-26 LAB — COMPREHENSIVE METABOLIC PANEL
Albumin: 2.9 g/dL — ABNORMAL LOW (ref 3.5–5.2)
BUN: 12 mg/dL (ref 6–23)
Calcium: 8.9 mg/dL (ref 8.4–10.5)
Chloride: 98 mEq/L (ref 96–112)
Creatinine, Ser: 0.69 mg/dL (ref 0.50–1.35)
Total Bilirubin: 0.8 mg/dL (ref 0.3–1.2)

## 2011-05-26 LAB — IRON AND TIBC
Iron: 14 ug/dL — ABNORMAL LOW (ref 42–135)
Saturation Ratios: 5 % — ABNORMAL LOW (ref 20–55)
TIBC: 270 ug/dL (ref 215–435)

## 2011-05-26 LAB — PREPARE RBC (CROSSMATCH)

## 2011-05-26 LAB — ABO/RH: ABO/RH(D): A POS

## 2011-05-26 LAB — EXPECTORATED SPUTUM ASSESSMENT W GRAM STAIN, RFLX TO RESP C

## 2011-05-26 LAB — FERRITIN: Ferritin: 34 ng/mL (ref 22–322)

## 2011-05-26 LAB — VITAMIN B12: Vitamin B-12: 1273 pg/mL — ABNORMAL HIGH (ref 211–911)

## 2011-05-26 MED ORDER — OLANZAPINE 5 MG PO TABS
25.0000 mg | ORAL_TABLET | Freq: Every day | ORAL | Status: DC
  Administered 2011-05-26 – 2011-05-30 (×5): 25 mg via ORAL
  Filled 2011-05-26 (×5): qty 5

## 2011-05-26 MED ORDER — FUROSEMIDE 10 MG/ML IJ SOLN
40.0000 mg | Freq: Once | INTRAMUSCULAR | Status: AC
  Administered 2011-05-26: 40 mg via INTRAVENOUS
  Filled 2011-05-26: qty 4

## 2011-05-26 MED ORDER — DIVALPROEX SODIUM 250 MG PO DR TAB
1500.0000 mg | DELAYED_RELEASE_TABLET | Freq: Every day | ORAL | Status: DC
  Administered 2011-05-26 – 2011-05-31 (×6): 1500 mg via ORAL
  Filled 2011-05-26 (×6): qty 6

## 2011-05-26 MED ORDER — GABAPENTIN 300 MG PO CAPS
600.0000 mg | ORAL_CAPSULE | Freq: Four times a day (QID) | ORAL | Status: DC
  Administered 2011-05-26 – 2011-05-31 (×22): 600 mg via ORAL
  Filled 2011-05-26 (×4): qty 2
  Filled 2011-05-26: qty 1
  Filled 2011-05-26 (×17): qty 2

## 2011-05-26 MED ORDER — DEXTROSE 5 % IV SOLN
1.0000 g | Freq: Three times a day (TID) | INTRAVENOUS | Status: DC
  Administered 2011-05-26 – 2011-05-30 (×14): 1 g via INTRAVENOUS
  Filled 2011-05-26 (×17): qty 1

## 2011-05-26 MED ORDER — LEVOFLOXACIN IN D5W 750 MG/150ML IV SOLN
INTRAVENOUS | Status: AC
  Filled 2011-05-26: qty 150

## 2011-05-26 MED ORDER — BIOTENE DRY MOUTH MT LIQD
15.0000 mL | Freq: Two times a day (BID) | OROMUCOSAL | Status: DC
  Administered 2011-05-26 – 2011-05-31 (×12): 15 mL via OROMUCOSAL

## 2011-05-26 MED ORDER — TIOTROPIUM BROMIDE MONOHYDRATE 18 MCG IN CAPS
18.0000 ug | ORAL_CAPSULE | Freq: Every day | RESPIRATORY_TRACT | Status: DC
  Administered 2011-05-26 – 2011-05-31 (×6): 18 ug via RESPIRATORY_TRACT
  Filled 2011-05-26 (×2): qty 5

## 2011-05-26 MED ORDER — LEVALBUTEROL HCL 0.63 MG/3ML IN NEBU
0.6300 mg | INHALATION_SOLUTION | Freq: Four times a day (QID) | RESPIRATORY_TRACT | Status: DC
  Administered 2011-05-26 – 2011-05-31 (×19): 0.63 mg via RESPIRATORY_TRACT
  Filled 2011-05-26 (×19): qty 3

## 2011-05-26 MED ORDER — BUSPIRONE HCL 5 MG PO TABS
15.0000 mg | ORAL_TABLET | Freq: Two times a day (BID) | ORAL | Status: DC
  Administered 2011-05-26 – 2011-05-31 (×11): 15 mg via ORAL
  Filled 2011-05-26 (×11): qty 3

## 2011-05-26 MED ORDER — ENSURE COMPLETE PO LIQD
237.0000 mL | Freq: Every day | ORAL | Status: DC
  Administered 2011-05-26 – 2011-05-30 (×5): 237 mL via ORAL

## 2011-05-26 MED ORDER — VANCOMYCIN HCL IN DEXTROSE 1-5 GM/200ML-% IV SOLN
1000.0000 mg | Freq: Two times a day (BID) | INTRAVENOUS | Status: DC
  Administered 2011-05-26 – 2011-05-27 (×3): 1000 mg via INTRAVENOUS
  Filled 2011-05-26 (×3): qty 200

## 2011-05-26 MED ORDER — PANTOPRAZOLE SODIUM 40 MG PO TBEC
40.0000 mg | DELAYED_RELEASE_TABLET | Freq: Every day | ORAL | Status: DC
  Administered 2011-05-26 – 2011-05-31 (×5): 40 mg via ORAL
  Filled 2011-05-26 (×5): qty 1

## 2011-05-26 MED ORDER — LEVOFLOXACIN IN D5W 750 MG/150ML IV SOLN
750.0000 mg | Freq: Every day | INTRAVENOUS | Status: DC
  Administered 2011-05-26 – 2011-05-30 (×6): 750 mg via INTRAVENOUS
  Filled 2011-05-26 (×7): qty 150

## 2011-05-26 MED ORDER — GABAPENTIN 600 MG PO TABS
600.0000 mg | ORAL_TABLET | Freq: Four times a day (QID) | ORAL | Status: DC
  Filled 2011-05-26 (×2): qty 1

## 2011-05-26 MED ORDER — METOPROLOL TARTRATE 25 MG PO TABS
12.5000 mg | ORAL_TABLET | Freq: Two times a day (BID) | ORAL | Status: DC
  Administered 2011-05-26 – 2011-05-28 (×5): 12.5 mg via ORAL
  Filled 2011-05-26 (×6): qty 1

## 2011-05-26 MED ORDER — FUROSEMIDE 10 MG/ML IJ SOLN
40.0000 mg | Freq: Every day | INTRAMUSCULAR | Status: DC
  Administered 2011-05-26 – 2011-05-27 (×2): 40 mg via INTRAVENOUS
  Filled 2011-05-26 (×2): qty 4

## 2011-05-26 MED ORDER — CLONAZEPAM 0.5 MG PO TABS
1.0000 mg | ORAL_TABLET | Freq: Two times a day (BID) | ORAL | Status: DC | PRN
  Administered 2011-05-27 – 2011-05-31 (×4): 1 mg via ORAL
  Filled 2011-05-26 (×4): qty 2

## 2011-05-26 MED ORDER — AMIODARONE HCL 200 MG PO TABS
200.0000 mg | ORAL_TABLET | Freq: Two times a day (BID) | ORAL | Status: DC
  Administered 2011-05-26 – 2011-05-31 (×11): 200 mg via ORAL
  Filled 2011-05-26 (×11): qty 1

## 2011-05-26 MED ORDER — DEXTROSE 5 % IV SOLN
INTRAVENOUS | Status: AC
  Filled 2011-05-26: qty 1

## 2011-05-26 MED ORDER — FLUTICASONE-SALMETEROL 250-50 MCG/DOSE IN AEPB
2.0000 | INHALATION_SPRAY | Freq: Every day | RESPIRATORY_TRACT | Status: DC
  Administered 2011-05-26 – 2011-05-31 (×7): 2 via RESPIRATORY_TRACT
  Filled 2011-05-26: qty 14

## 2011-05-26 MED ORDER — IOHEXOL 300 MG/ML  SOLN
80.0000 mL | Freq: Once | INTRAMUSCULAR | Status: AC | PRN
  Administered 2011-05-26: 80 mL via INTRAVENOUS

## 2011-05-26 MED ORDER — HYDROCODONE-ACETAMINOPHEN 5-325 MG PO TABS
1.0000 | ORAL_TABLET | Freq: Four times a day (QID) | ORAL | Status: DC | PRN
  Administered 2011-05-26 – 2011-05-31 (×15): 1 via ORAL
  Filled 2011-05-26 (×15): qty 1

## 2011-05-26 MED ORDER — SODIUM CHLORIDE 0.9 % IJ SOLN
3.0000 mL | Freq: Two times a day (BID) | INTRAMUSCULAR | Status: DC
  Administered 2011-05-27 – 2011-05-30 (×4): 3 mL via INTRAVENOUS
  Filled 2011-05-26 (×5): qty 3
  Filled 2011-05-26: qty 6
  Filled 2011-05-26: qty 3

## 2011-05-26 NOTE — Consult Note (Signed)
ANTIBIOTIC CONSULT NOTE - INITIAL  Pharmacy Consult for Vancomycin, Levaquin, Cefepime Indication: rule out pneumonia  Allergies  Allergen Reactions  . Albuterol Anaphylaxis    Patient Measurements: Height: 5\' 9"  (175.3 cm) Weight: 127 lb 3.3 oz (57.7 kg) IBW/kg (Calculated) : 70.7   Vital Signs: Temp: 98.7 F (37.1 C) (03/20 0647) Temp src: Oral (03/20 0647) BP: 101/70 mmHg (03/20 0647) Pulse Rate: 82  (03/20 0647) Intake/Output from previous day: 03/19 0701 - 03/20 0700 In: 1260.5 [P.O.:600; Blood:406.5; IV Piggyback:254] Out: 250 [Urine:250] Intake/Output from this shift:    Labs:  [facility] 05/25/11 1954  WBC 12.5*  HGB 6.2*  PLT 286  LABCREA --  CREATININE 0.71   Estimated Creatinine Clearance: 80.1 ml/min (by C-G formula based on Cr of 0.71). No results found for this basename: VANCOTROUGH:2,VANCOPEAK:2,VANCORANDOM:2,GENTTROUGH:2,GENTPEAK:2,GENTRANDOM:2,TOBRATROUGH:2,TOBRAPEAK:2,TOBRARND:2,AMIKACINPEAK:2,AMIKACINTROU:2,AMIKACIN:2, in the last 72 hours   Microbiology: Recent Results (from the past 720 hour(s))  CULTURE, BLOOD (ROUTINE X 2)     Status: Normal (Preliminary result)   Collection Time   05/25/11  9:31 PM      Component Value Range Status Comment   Specimen Description BLOOD LEFT FOREARM   Final    Special Requests BOTTLES DRAWN AEROBIC AND ANAEROBIC 6CC   Final    Culture PENDING   Incomplete    Report Status PENDING   Incomplete   CULTURE, BLOOD (ROUTINE X 2)     Status: Normal (Preliminary result)   Collection Time   05/25/11  9:33 PM      Component Value Range Status Comment   Specimen Description BLOOD RIGHT FOREARM   Final    Special Requests BOTTLES DRAWN AEROBIC AND ANAEROBIC 5CC   Final    Culture PENDING   Incomplete    Report Status PENDING   Incomplete   MRSA PCR SCREENING     Status: Normal   Collection Time   05/26/11 12:53 AM      Component Value Range Status Comment   MRSA by PCR NEGATIVE  NEGATIVE  Final     Medical  History: Past Medical History  Diagnosis Date  . Bipolar disorder   . COPD (chronic obstructive pulmonary disease)     PFTs done in 11/09 show FVC of 108%, FEV! of 64%, and FEF25-25% of 30%. There was 23% improvment in the FEV1 with a bronchodilator.   . Essential hypertension, benign   . Chronic pain disorder   . Aortic stenosis     Appeared severe with a mean gradient of 42 mm of mercury and a dimensionless index to 0.24. The aorti valve itself was not well visualized.  . Bicuspid aortic valve     TEE in 11/09. Planimetered valve area was 1.1 cm sqaured witha a mean gradient 32 mmHg. No aortic coarctation. No aneurysm of thoracic aorta.  Marland Kitchen DJD (degenerative joint disease) of cervical spine   . DJD (degenerative joint disease) of lumbar spine   . Seizures     Details unclear, 2 episodes last 5 months  . Atrial fibrillation     In setting of acute illness 2/13  . Pneumonia   . Anemia     GI bleed requiring transfusion.  . Internal hemorrhoids     Colonoscopy 2/13  . Reflux esophagitis     EGD 2/13    Medications:  Scheduled:    . sodium chloride   Intravenous Once  . amiodarone  200 mg Oral BID  . antiseptic oral rinse  15 mL Mouth Rinse BID  . busPIRone  15 mg Oral BID  . ceFEPime (MAXIPIME) IV  1 g Intravenous Q8H  . divalproex  1,500 mg Oral Daily  . Fluticasone-Salmeterol  2 puff Inhalation Daily  . furosemide  40 mg Intravenous Once  . gabapentin  600 mg Oral QID  . ipratropium  0.5 mg Nebulization Once  . levalbuterol  0.63 mg Nebulization Q6H  . levofloxacin (LEVAQUIN) IV  750 mg Intravenous QHS  . metoprolol tartrate  12.5 mg Oral BID  . OLANZapine  25 mg Oral QHS  . pantoprazole  40 mg Oral Daily  . piperacillin-tazobactam (ZOSYN)  IV  3.375 g Intravenous Once  . sodium chloride  3 mL Intravenous Q12H  . tiotropium  18 mcg Inhalation Daily  . vancomycin  1,000 mg Intravenous Once  . vancomycin  1,000 mg Intravenous Q12H  . DISCONTD: albuterol  5 mg  Nebulization Once  . DISCONTD: ipratropium  0.5 mg Nebulization Once   Assessment: Renal fxn OK No documented allergies to these ABX  Goal of Therapy:  Vancomycin trough level 15-20 mcg/ml Eradicate infection.  Plan: Vancomycin 1gm iv q12hrs Check trough at steady state Cefepime 1gm iv q8hrs levaquin 750mg  iv q24hrs Labs per protocol De-escalate ABX when appropriate  Valrie Hart A 05/26/2011,8:19 AM

## 2011-05-26 NOTE — Progress Notes (Signed)
CARE MANAGEMENT NOTE 05/26/2011  Patient:  Wayne Guzman, Wayne Guzman   Account Number:  [id number]  Date Initiated:  05/26/2011  Documentation initiated by:  Rosemary Holms  Subjective/Objective Assessment:   Pt admitted with PNA and anemia. PTA lived alone.     Action/Plan:   DC to home. No anticipated HH needs   Anticipated DC Date:  05/27/2011   Anticipated DC Plan:  HOME/SELF CARE      DC Planning Services  CM consult      Choice offered to / List presented to:             Status of service:  In process, will continue to follow Medicare Important Message given?   (If response is "NO", the following Medicare IM given date fields will be blank) Date Medicare IM given:   Date Additional Medicare IM given:    Discharge Disposition:    Per UR Regulation:    If discussed at Long Length of Stay Meetings, dates discussed:    Comments:  05/26/11 1000 Chanya Chrisley Leanord Hawking RN BSN CM

## 2011-05-26 NOTE — Progress Notes (Signed)
Paged Dr. Orvan Falconer, that pt is having trouble breathing at times, he feels congested and then can't breathe. Pt complaining of mild chest pain, central and left side. Pt sounds congested, coughing encouraged. Pt produced moderate amount of yellow, thick sputum. I placed order for STAT EKG, and placed pt on 2L of O2. Waiting orders from the MD. Sheryn Bison

## 2011-05-26 NOTE — Progress Notes (Signed)
INITIAL ADULT NUTRITION ASSESSMENT Date: 05/26/2011   Time: 9:42 AM Reason for Assessment: Screened for nutrition risk, unintentional weight loss > 10 lb. Over 1 month  ASSESSMENT: Male 61 y.o.  Dx: HCAP (healthcare-associated pneumonia)  Hx:  Past Medical History  Diagnosis Date  . Bipolar disorder   . COPD (chronic obstructive pulmonary disease)     PFTs done in 11/09 show FVC of 108%, FEV! of 64%, and FEF25-25% of 30%. There was 23% improvment in the FEV1 with a bronchodilator.   . Essential hypertension, benign   . Chronic pain disorder   . Aortic stenosis     Appeared severe with a mean gradient of 42 mm of mercury and a dimensionless index to 0.24. The aorti valve itself was not well visualized.  . Bicuspid aortic valve     TEE in 11/09. Planimetered valve area was 1.1 cm sqaured witha a mean gradient 32 mmHg. No aortic coarctation. No aneurysm of thoracic aorta.  Marland Kitchen DJD (degenerative joint disease) of cervical spine   . DJD (degenerative joint disease) of lumbar spine   . Seizures     Details unclear, 2 episodes last 5 months  . Atrial fibrillation     In setting of acute illness 2/13  . Pneumonia   . Anemia     GI bleed requiring transfusion.  . Internal hemorrhoids     Colonoscopy 2/13  . Reflux esophagitis     EGD 2/13   Related Meds:  Scheduled Meds:   . sodium chloride   Intravenous Once  . amiodarone  200 mg Oral BID  . antiseptic oral rinse  15 mL Mouth Rinse BID  . busPIRone  15 mg Oral BID  . ceFEPime (MAXIPIME) IV  1 g Intravenous Q8H  . divalproex  1,500 mg Oral Daily  . Fluticasone-Salmeterol  2 puff Inhalation Daily  . furosemide  40 mg Intravenous Once  . gabapentin  600 mg Oral QID  . ipratropium  0.5 mg Nebulization Once  . levalbuterol  0.63 mg Nebulization Q6H  . levofloxacin (LEVAQUIN) IV  750 mg Intravenous QHS  . metoprolol tartrate  12.5 mg Oral BID  . OLANZapine  25 mg Oral QHS  . pantoprazole  40 mg Oral Daily  .  piperacillin-tazobactam (ZOSYN)  IV  3.375 g Intravenous Once  . sodium chloride  3 mL Intravenous Q12H  . tiotropium  18 mcg Inhalation Daily  . vancomycin  1,000 mg Intravenous Once  . vancomycin  1,000 mg Intravenous Q12H  . DISCONTD: albuterol  5 mg Nebulization Once  . DISCONTD: gabapentin  600 mg Oral QID  . DISCONTD: ipratropium  0.5 mg Nebulization Once   Continuous Infusions:  PRN Meds:.clonazePAM, HYDROcodone-acetaminophen, iohexol  Ht: 5\' 9"  (175.3 cm)  Wt: 127 lb 3.3 oz (57.7 kg)  Ideal Wt: 72.7 kg % Ideal Wt: 79.3%  Usual Wt: 153 lb.  % Usual Wt: 83%  Body mass index is 18.78 kg/(m^2). (WNL)  Food/Nutrition Related Hx: Patient stated unintentional weight loss over the past 5 months from UBW of 153 lb. To current weight of 127lb. (26 lb. Weight loss over past 5 months.). Patient appears thin. Per RN patient PO intake is fair.   Labs:  CMP     Component Value Date/Time   NA 139 05/25/2011 1954   NA 136 01/05/2011 0850   K 4.2 05/25/2011 1954   CL 102 05/25/2011 1954   CO2 30 05/25/2011 1954   GLUCOSE 122* 05/25/2011 1954   GLUCOSE  82 01/05/2011 0850   BUN 19 05/25/2011 1954   BUN 12 01/05/2011 0850   CREATININE 0.71 05/25/2011 1954   CALCIUM 8.7 05/25/2011 1954   PROT 7.3 02/14/2009 2200   ALBUMIN 4.6 02/14/2009 2200   AST 25 02/14/2009 2200   ALT 31 02/14/2009 2200   ALKPHOS 47 02/14/2009 2200   BILITOT 0.3 02/14/2009 2200   GFRNONAA >90 05/25/2011 1954   GFRAA >90 05/25/2011 1954    Intake/Output Summary (Last 24 hours) at 05/26/11 0944 Last data filed at 05/26/11 0650  Gross per 24 hour  Intake 1260.5 ml  Output    250 ml  Net 1010.5 ml    Diet Order: Cardiac  Supplements/Tube Feeding: none at this time  IVF:    Estimated Nutritional Needs:   Kcal: 1380-1680 Protein: 63.5-75 grams Fluid: 1 ml per kcal   NUTRITION DIAGNOSIS: -Increased nutrient needs  RELATED TO: unintentional weight loss  AS EVIDENCE BY: patient reports unintentional  weight loss of 26 lb. Over the past 5 months, 83% of UBW,  and thin appearance.   MONITORING/EVALUATION(Goals): Labs, Weight trends, PO intake 1. Meet > 90% of estimated energy needs 2. PO intake >75% at meals and supplements 3. Promote weight maintenance/ gain  EDUCATION NEEDS: -No education needs identified at this time  INTERVENTION: 1. Will order Ensure nutrition supplement once daily. Provides 350 kcal and 13 grams of protein.  2. RD to follow for nutrition needs.   Dietitian [phone]  DOCUMENTATION CODES Per approved criteria  - Not Applicaable    Iven Finn [facility] 05/26/2011, 9:42 AM

## 2011-05-26 NOTE — Progress Notes (Signed)
Subjective: This man was admitted yesterday with productive cough and fever. The admission chest x-ray was not particularly impressive for pneumonia but I wonder if he does have congestive heart failure, bearing in mind he has critical aortic stenosis and atrial fibrillation. He also was profoundly anemic and he is now receiving blood transfusion. His Hemoccult stools were positive for blood.           Physical Exam: Blood pressure 101/70, pulse 82, temperature 98.7 F (37.1 C), temperature source Oral, resp. rate 20, height 5\' 9"  (1.753 m), weight 57.7 kg (127 lb 3.3 oz), SpO2 93.00%. He looks chronically sick. He is pale. However, he does not have increased work of breathing. Heart sounds are present and atrial fibrillation clinically with a systolic murmur typical of aortic stenosis. Lung fields actually sound clear with no evidence of crackles, wheezes or bronchial breathing. Abdomen is soft and nontender. He is alert and orientated.   Investigations:  Recent Results (from the past 240 hour(s))  CULTURE, BLOOD (ROUTINE X 2)     Status: Normal (Preliminary result)   Collection Time   05/25/11  9:31 PM      Component Value Range Status Comment   Specimen Description BLOOD LEFT FOREARM   Final    Special Requests BOTTLES DRAWN AEROBIC AND ANAEROBIC 6CC   Final    Culture PENDING   Incomplete    Report Status PENDING   Incomplete   CULTURE, BLOOD (ROUTINE X 2)     Status: Normal (Preliminary result)   Collection Time   05/25/11  9:33 PM      Component Value Range Status Comment   Specimen Description BLOOD RIGHT FOREARM   Final    Special Requests BOTTLES DRAWN AEROBIC AND ANAEROBIC 5CC   Final    Culture PENDING   Incomplete    Report Status PENDING   Incomplete   MRSA PCR SCREENING     Status: Normal   Collection Time   05/26/11 12:53 AM      Component Value Range Status Comment   MRSA by PCR NEGATIVE  NEGATIVE  Final      Basic Metabolic Panel:  Basename 05/25/11 1954    NA 139  K 4.2  CL 102  CO2 30  GLUCOSE 122*  BUN 19  CREATININE 0.71  CALCIUM 8.7  MG --  PHOS --   Liver Function Tests:    CBC:  Basename 05/25/11 2126 05/25/11 1954  WBC -- 12.5*  NEUTROABS 9.4* --  HGB -- 6.2*  HCT -- 19.9*  MCV -- 84.0  PLT -- 286    Dg Chest Portable 1 View  05/25/2011  *RADIOLOGY REPORT*  Clinical Data: Shortness of breath; emesis.  History of smoking.  PORTABLE CHEST - 1 VIEW  Comparison: Chest radiograph performed 10/14/2008  Findings: The lungs are well-aerated.  Vascular congestion is noted.  Diffusely increased interstitial markings are noted, more prominent at the left lung base.  The appearance raises question for mild interstitial edema, though the presence of more focal left basilar opacity raises concern for interstitial lung disease. Alternatively, pneumonia might have a similar appearance.  There is no evidence of pleural effusion or pneumothorax.  The cardiomediastinal silhouette is within normal limits.  No acute osseous abnormalities are seen.  IMPRESSION: Diffusely increased interstitial markings, more prominent at the left lung base, with underlying vascular congestion.  Though this could reflect interstitial edema, the appearance raises question for interstitial lung disease.  Alternatively, left basilar pneumonia might have  a similar appearance.  Original Report Authenticated By: Tonia Ghent, M.D.      Medications: I have reviewed the patient's current medications.  Impression: 1. Healthcare associated pneumonia. 2. Possible congestive heart failure. 3. Critical aortic stenosis. 4. Atrial fibrillation. 5. Severe anemia with Hemoccult stool positive. 6. Bipolar disorder.     Plan: 1. Agree with blood transfusion. 2. Gastroenterology consultation. 3. Cardiology consultation.     LOS: 1 day   Wilson Singer Pager [phone]  05/26/2011, 10:01 AM

## 2011-05-27 DIAGNOSIS — K922 Gastrointestinal hemorrhage, unspecified: Secondary | ICD-10-CM

## 2011-05-27 DIAGNOSIS — D649 Anemia, unspecified: Secondary | ICD-10-CM

## 2011-05-27 DIAGNOSIS — I359 Nonrheumatic aortic valve disorder, unspecified: Secondary | ICD-10-CM

## 2011-05-27 DIAGNOSIS — I4891 Unspecified atrial fibrillation: Secondary | ICD-10-CM

## 2011-05-27 LAB — COMPREHENSIVE METABOLIC PANEL
Albumin: 2.6 g/dL — ABNORMAL LOW (ref 3.5–5.2)
BUN: 9 mg/dL (ref 6–23)
CO2: 32 mEq/L (ref 19–32)
Chloride: 99 mEq/L (ref 96–112)
Creatinine, Ser: 0.65 mg/dL (ref 0.50–1.35)
GFR calc non Af Amer: >90 mL/min (ref >90)
Total Bilirubin: 0.5 mg/dL (ref 0.3–1.2)

## 2011-05-27 LAB — CBC
HCT: 29 % — ABNORMAL LOW (ref 39.0–52.0)
MCV: 84.8 fL (ref 78.0–100.0)
RDW: 18.5 % — ABNORMAL HIGH (ref 11.5–15.5)
WBC: 10.9 10*3/uL — ABNORMAL HIGH (ref 4.0–10.5)

## 2011-05-27 LAB — VANCOMYCIN, TROUGH: Vancomycin Tr: 9.8 ug/mL — ABNORMAL LOW (ref 10.0–20.0)

## 2011-05-27 LAB — PRO B NATRIURETIC PEPTIDE: Pro B Natriuretic peptide (BNP): 1804 pg/mL — ABNORMAL HIGH (ref 0–125)

## 2011-05-27 MED ORDER — FOOD THICKENER (THICKENUP CLEAR)
ORAL | Status: DC | PRN
  Administered 2011-05-28: 15:00:00 via ORAL
  Filled 2011-05-27: qty 120

## 2011-05-27 MED ORDER — VANCOMYCIN HCL 1000 MG IV SOLR
1250.0000 mg | Freq: Two times a day (BID) | INTRAVENOUS | Status: DC
  Administered 2011-05-28 – 2011-05-29 (×5): 1250 mg via INTRAVENOUS
  Filled 2011-05-27 (×9): qty 1250

## 2011-05-27 MED ORDER — SACCHAROMYCES BOULARDII 250 MG PO CAPS
250.0000 mg | ORAL_CAPSULE | Freq: Two times a day (BID) | ORAL | Status: DC
  Administered 2011-05-27 – 2011-05-31 (×8): 250 mg via ORAL
  Filled 2011-05-27: qty 1
  Filled 2011-05-27: qty 3
  Filled 2011-05-27 (×6): qty 1
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
## 2011-05-27 NOTE — Evaluation (Signed)
Clinical/Bedside Swallow Evaluation Patient Details  Name: Wayne Guzman MRN: [id number] DOB: 1950/12/07 Today's Date: 05/27/2011  Past Medical History:  Past Medical History  Diagnosis Date  . Bipolar disorder   . COPD (chronic obstructive pulmonary disease)     PFTs done in 11/09 show FVC of 108%, FEV! of 64%, and FEF25-25% of 30%. There was 23% improvment in the FEV1 with a bronchodilator.   . Essential hypertension, benign   . Chronic pain disorder   . Aortic stenosis     Appeared severe with a mean gradient of 42 mm of mercury and a dimensionless index to 0.24. The aorti valve itself was not well visualized.  . Bicuspid aortic valve     TEE in 11/09. Planimetered valve area was 1.1 cm sqaured witha a mean gradient 32 mmHg. No aortic coarctation. No aneurysm of thoracic aorta.  Marland Kitchen DJD (degenerative joint disease) of cervical spine   . DJD (degenerative joint disease) of lumbar spine   . Seizures     Details unclear, 2 episodes last 5 months  . Atrial fibrillation     In setting of acute illness 2/13  . Pneumonia   . Anemia     GI bleed requiring transfusion.  . Internal hemorrhoids     Colonoscopy 2/13  . Reflux esophagitis     EGD 2/13   Past Surgical History:  Past Surgical History  Procedure Date  . Nasal sinus surgery   . Knee surgery   . Carpal tunnel release    HPI:  [name] is a 61 yo male who was admitted to [facility] with chest congestion from [facility]. He refused to work with me earlier today and vehemently denied dysphagi, however I spoke with the SLP at [facility] who reported that he wa on nectar-thick liquids over there and allowed to have free water [employer] Protocol) after oral care.    Assessment/Recommendations/Treatment Plan Suspected Esophageal Findings Suspected Esophageal Findings:  (Pt reports reflux)  SLP Assessment Clinical Impression Statement: [name] has a somewhat complicated medical picture and his history is not exactly easy to  follow given his report. He apparantly had an objective swallow test at [facility] and was placed on NTL. Upon admission to [facility], he was upgraded to regular textures, kept on NTL, but allowed to have free water only between meals after oral care. He also states that he used a mouthwash for possible thrush and wishes to have it here. I had a long discussion with him about repercussions of aspiration pna and he initially was extremely defensive and unwilling to acknowledge the information. He states that he wants to get better so he can have his heart surgery, but does not see any correlation to aspiration pna. His nurse spoke with him as well and he suddenly became agreeable and friendly. He also admitted to being non-compliant with the [facility] Protocol at [employer] and "snuck" other thin drinks. He says that he is agreeable to sticking to the plan (NTL with meals, thin water only between meals after oral care) with possible reassessment via MBSS as an outpatient if needed in a week. Risk for Aspiration: Moderate Other Related Risk Factors: History of pneumonia;History of dysphagia;History of GERD (Decreased awareness to oral residuals/wet vocal quality)  Swallow Evaluation Recommendations Recommended Consults: MBS (repeat MBSS when clinically appropriate per treating SLP) General Recommendation: Free water protocol after oral care Solid Consistency: Regular Liquid Consistency: Nectar (Pt likes Wayne Guzman and buttermilk) Liquid Administration via: Straw;Cup Medication Administration:  Whole meds with liquid (take with NTL) Supervision: Patient able to self feed;Intermittent supervision to cue for compensatory strategies Compensations: Slow rate;Small sips/bites;Multiple dry swallows after each bite/sip;Clear throat intermittently Postural Changes and/or Swallow Maneuvers: Seated upright 90 degrees;Upright 30-60 min after meal;Out of bed for meals Oral Care Recommendations: Oral care QID Other  Recommendations: Order thickener from pharmacy;Clarify dietary restrictions Follow up Recommendations: Skilled Nursing facility  Treatment Plan Treatment Plan Recommendations: Therapy as outlined in treatment plan below Speech Therapy Frequency: min 2x/week Treatment Duration: 1 week Interventions: Aspiration precaution training;Pharyngeal strengthening exercises;Patient/family education;Compensatory techniques;Diet toleration management by SLP  Prognosis Prognosis for Safe Diet Advancement: Fair Barriers to Reach Goals: Behavior  Individuals Consulted Consulted and Agree with Results and Recommendations: Patient;RN;MD;Other (Comment) (SLP at [facility])  Swallowing Goals  SLP Swallowing Goals Patient will consume recommended diet without observed clinical signs of aspiration with: Minimal assistance Patient will utilize recommended strategies during swallow to increase swallowing safety with: Minimal assistance  Swallow Study Prior Functional Status   Pt was admitted from [facility] and was on a regular diet with nectar-thick liquids and [employer] Levels water protocol (he admits to non-compliance).  General  Date of Onset: 05/26/11 HPI: [name] is a 61 yo male who was admitted to [facility] with chest congestion from [facility]. He refused to work with me earlier today and vehemently denied dysphagi, however I spoke with the SLP at [facility] who reported that he wa on nectar-thick liquids over there and allowed to have free water [employer] Protocol) after oral care.  Type of Study: Bedside swallow evaluation Diet Prior to this Study: Regular;Nectar-thick liquids (free water only between meals after oral care) Respiratory Status: Room air History of Intubation: Yes Length of Intubations (days):  (unknown) Date extubated:  (unknown) Behavior/Cognition: Alert;Agitated;Uncooperative;Cooperative Oral Cavity - Dentition: Adequate natural dentition Vision: Functional for self-feeding Patient  Positioning: Upright in bed Baseline Vocal Quality: Wet Volitional Cough: Strong;Congested Volitional Swallow: Able to elicit  Oral Motor/Sensory Function  Overall Oral Motor/Sensory Function: Appears within functional limits for tasks assessed  Consistency Results  Ice Chips Ice chips: Not tested  Thin Liquid Presentation: Straw Other Comments: wet vocal quality  Nectar Thick Liquid Nectar Thick Liquid: Impaired Presentation: Straw Oral Phase Impairments: Reduced labial seal Oral phase functional implications:  (residuals along corners of mouth/lips/tongue) Pharyngeal Phase Impairments: Decreased hyoid-laryngeal movement Other Comments: pt cued to cough and repeat swallow which showed improved vocal quality  Honey Thick Liquid Honey Thick Liquid: Not tested  Puree Other Comments: Pt refused  Solid Other Comments: Pt refused by did eat most of his dinner tray   Nabilah Davoli 05/27/2011,7:02 PM

## 2011-05-27 NOTE — Consult Note (Signed)
Reason for Consult:anemia Referring Physician: [name] is an 61 y.o. male.  HPI: Wayne Guzman is a 61 yr old male admitted thru the ED yesterday with pneumonia.  He is a resident of [facility] x 1 week for rehab due to pneumonia. He was recently a patient at [facility] for SOB and weakness. He was found to be anemic He apparently had an EGD and a colonoscopy at [facility]. He thinks both were normal.  The records have been requested. He received 2 units of blood at [place]. He denies prior hx of anemia.  He says he has lost about 50 pounds over the past 7 months.   He tells me his stools are dark from taking iron.  He has had a cough and coughing up yellow mucous.  He has had small amt of nausea and vomiting. His acid reflux is controlled with PPI. He denies dysphagia.  He had a bleeding scan at [facility] which was negative per niece.  Per his niece he was intubated at [facility] and Given Capsule was postponed. Since admission here, he has been transfused with 2 units of PRBCs. He was guaiac positive in the ED.  Hx of atrial fib. He is presently in a NSR per EKG.  .      Past Medical History  Diagnosis Date  . Bipolar disorder   . COPD (chronic obstructive pulmonary disease)     PFTs done in 11/09 show FVC of 108%, FEV! of 64%, and FEF25-25% of 30%. There was 23% improvment in the FEV1 with a bronchodilator.   . Essential hypertension, benign   . Chronic pain disorder   . Aortic stenosis     Appeared severe with a mean gradient of 42 mm of mercury and a dimensionless index to 0.24. The aorti valve itself was not well visualized.  . Bicuspid aortic valve     TEE in 11/09. Planimetered valve area was 1.1 cm sqaured witha a mean gradient 32 mmHg. No aortic coarctation. No aneurysm of thoracic aorta.  Marland Kitchen DJD (degenerative joint disease) of cervical spine   . DJD (degenerative joint disease) of lumbar spine   . Seizures     Details unclear, 2 episodes last 5 months  . Atrial  fibrillation     In setting of acute illness 2/13  . Pneumonia   . Anemia     GI bleed requiring transfusion.  . Internal hemorrhoids     Colonoscopy 2/13  . Reflux esophagitis     EGD 2/13    Past Surgical History  Procedure Date  . Nasal sinus surgery   . Knee surgery   . Carpal tunnel release     Family History  Problem Relation Age of Onset  . Coronary artery disease Father   . Aortic stenosis Neg Hx     Social History:  reports that he quit smoking about 6 weeks ago. His smoking use included Cigarettes. He has never used smokeless tobacco. He reports that he does not drink alcohol or use illicit drugs.  Allergies:  Allergies  Allergen Reactions  . Albuterol Anaphylaxis    Medications: I have reviewed the patient's current medications.  Results for orders placed during the hospital encounter of 05/25/11 (from the past 48 hour(s))  CBC     Status: Abnormal   Collection Time   05/25/11  7:54 PM      Component Value Range Comment   WBC 12.5 (*) 4.0 - 10.5 (K/uL)    RBC  2.37 (*) 4.22 - 5.81 (MIL/uL)    Hemoglobin 6.2 (*) 13.0 - 17.0 (g/dL)    HCT 16.1 (*) 09.6 - 52.0 (%)    MCV 84.0  78.0 - 100.0 (fL)    MCH 26.2  26.0 - 34.0 (pg)    MCHC 31.2  30.0 - 36.0 (g/dL)    RDW 04.5 (*) 40.9 - 15.5 (%)    Platelets 286  150 - 400 (K/uL)   BASIC METABOLIC PANEL     Status: Abnormal   Collection Time   05/25/11  7:54 PM      Component Value Range Comment   Sodium 139  135 - 145 (mEq/L)    Potassium 4.2  3.5 - 5.1 (mEq/L)    Chloride 102  96 - 112 (mEq/L)    CO2 30  19 - 32 (mEq/L)    Glucose, Bld 122 (*) 70 - 99 (mg/dL)    BUN 19  6 - 23 (mg/dL)    Creatinine, Ser 8.11  0.50 - 1.35 (mg/dL)    Calcium 8.7  8.4 - 10.5 (mg/dL)    GFR calc non Af Amer >90  >90 (mL/min)    GFR calc Af Amer >90  >90 (mL/min)   PRO B NATRIURETIC PEPTIDE     Status: Abnormal   Collection Time   05/25/11  7:54 PM      Component Value Range Comment   Pro B Natriuretic peptide (BNP) 1380.0  (*) 0 - 125 (pg/mL)   TROPONIN I     Status: Normal   Collection Time   05/25/11  7:54 PM      Component Value Range Comment   Troponin I <0.30  <0.30 (ng/mL)   DIFFERENTIAL     Status: Abnormal   Collection Time   05/25/11  9:26 PM      Component Value Range Comment   Neutrophils Relative 74  43 - 77 (%)    Neutro Abs 9.4 (*) 1.7 - 7.7 (K/uL)    Lymphocytes Relative 15  12 - 46 (%)    Lymphs Abs 1.9  0.7 - 4.0 (K/uL)    Monocytes Relative 11  3 - 12 (%)    Monocytes Absolute 1.4 (*) 0.1 - 1.0 (K/uL)    Eosinophils Relative 0  0 - 5 (%)    Eosinophils Absolute 0.0  0.0 - 0.7 (K/uL)    Basophils Relative 0  0 - 1 (%)    Basophils Absolute 0.0  0.0 - 0.1 (K/uL)   CULTURE, BLOOD (ROUTINE X 2)     Status: Normal (Preliminary result)   Collection Time   05/25/11  9:31 PM      Component Value Range Comment   Specimen Description BLOOD LEFT FOREARM      Special Requests BOTTLES DRAWN AEROBIC AND ANAEROBIC 6CC      Culture NO GROWTH 1 DAY      Report Status PENDING     CULTURE, BLOOD (ROUTINE X 2)     Status: Normal (Preliminary result)   Collection Time   05/25/11  9:33 PM      Component Value Range Comment   Specimen Description BLOOD RIGHT FOREARM      Special Requests BOTTLES DRAWN AEROBIC AND ANAEROBIC 5CC      Culture NO GROWTH 1 DAY      Report Status PENDING     VITAMIN B12     Status: Abnormal   Collection Time   05/26/11 12:00 AM  Component Value Range Comment   Vitamin B-12 1273 (*) 211 - 911 (pg/mL)   FOLATE     Status: Normal   Collection Time   05/26/11 12:00 AM      Component Value Range Comment   Folate 18.4     IRON AND TIBC     Status: Abnormal   Collection Time   05/26/11 12:00 AM      Component Value Range Comment   Iron 14 (*) 42 - 135 (ug/dL)    TIBC 161  096 - 045 (ug/dL)    Saturation Ratios 5 (*) 20 - 55 (%)    UIBC 256  125 - 400 (ug/dL)   FERRITIN     Status: Normal   Collection Time   05/26/11 12:00 AM      Component Value Range Comment    Ferritin 34  22 - 322 (ng/mL)   RETICULOCYTES     Status: Abnormal   Collection Time   05/26/11 12:00 AM      Component Value Range Comment   Retic Ct Pct 6.8 (*) 0.4 - 3.1 (%)    RBC. 2.37 (*) 4.22 - 5.81 (MIL/uL)    Retic Count, Manual 161.2  19.0 - 186.0 (K/uL)   TYPE AND SCREEN     Status: Normal (Preliminary result)   Collection Time   05/26/11 12:03 AM      Component Value Range Comment   ABO/RH(D) A POS      Antibody Screen NEG      Sample Expiration 05/29/2011      Unit Number [id number]      Blood Component Type RED CELLS,LR      Unit division 00      Status of Unit ISSUED      Transfusion Status OK TO TRANSFUSE      Crossmatch Result Compatible      Unit Number [id number]      Blood Component Type RED CELLS,LR      Unit division 00      Status of Unit ISSUED      Transfusion Status OK TO TRANSFUSE      Crossmatch Result Compatible      Unit Number [id number]      Blood Component Type RED CELLS,LR      Unit division 00      Status of Unit ALLOCATED      Transfusion Status OK TO TRANSFUSE      Crossmatch Result Compatible      Unit Number [id number]      Blood Component Type RED CELLS,LR      Unit division 00      Status of Unit ALLOCATED      Transfusion Status OK TO TRANSFUSE      Crossmatch Result Compatible     ABO/RH     Status: Normal   Collection Time   05/26/11 12:03 AM      Component Value Range Comment   ABO/RH(D) A POS     PREPARE RBC (CROSSMATCH)     Status: Normal   Collection Time   05/26/11 12:03 AM      Component Value Range Comment   Order Confirmation ORDER PROCESSED BY BLOOD BANK     MRSA PCR SCREENING     Status: Normal   Collection Time   05/26/11 12:53 AM      Component Value Range Comment   MRSA by PCR NEGATIVE  NEGATIVE    COMPREHENSIVE METABOLIC PANEL  Status: Abnormal   Collection Time   05/26/11 10:52 AM      Component Value Range Comment   Sodium 138  135 - 145 (mEq/L)    Potassium 4.0  3.5 - 5.1 (mEq/L)    Chloride 98  96 - 112  (mEq/L)    CO2 31  19 - 32 (mEq/L)    Glucose, Bld 97  70 - 99 (mg/dL)    BUN 12  6 - 23 (mg/dL)    Creatinine, Ser 1.61  0.50 - 1.35 (mg/dL)    Calcium 8.9  8.4 - 10.5 (mg/dL)    Total Protein 6.6  6.0 - 8.3 (g/dL)    Albumin 2.9 (*) 3.5 - 5.2 (g/dL)    AST 28  0 - 37 (U/L)    ALT 47  0 - 53 (U/L)    Alkaline Phosphatase 64  39 - 117 (U/L)    Total Bilirubin 0.8  0.3 - 1.2 (mg/dL)    GFR calc non Af Amer >90  >90 (mL/min)    GFR calc Af Amer >90  >90 (mL/min)   CBC     Status: Abnormal   Collection Time   05/26/11 10:52 AM      Component Value Range Comment   WBC 16.4 (*) 4.0 - 10.5 (K/uL)    RBC 3.34 (*) 4.22 - 5.81 (MIL/uL)    Hemoglobin 9.4 (*) 13.0 - 17.0 (g/dL) DELTA CHECK NOTED   HCT 27.9 (*) 39.0 - 52.0 (%)    MCV 83.5  78.0 - 100.0 (fL)    MCH 28.1  26.0 - 34.0 (pg)    MCHC 33.7  30.0 - 36.0 (g/dL)    RDW 09.6 (*) 04.5 - 15.5 (%)    Platelets 279  150 - 400 (K/uL)   CULTURE, SPUTUM-ASSESSMENT     Status: Normal   Collection Time   05/26/11  1:21 PM      Component Value Range Comment   Specimen Description SPUTUM EXPECTORATED      Special Requests NONE      Sputum evaluation        Value: THIS SPECIMEN IS ACCEPTABLE. RESPIRATORY CULTURE REPORT TO FOLLOW.     Performed at [facility]   Report Status 05/26/2011 FINAL     CBC     Status: Abnormal   Collection Time   05/27/11  4:23 AM      Component Value Range Comment   WBC 10.9 (*) 4.0 - 10.5 (K/uL)    RBC 3.42 (*) 4.22 - 5.81 (MIL/uL)    Hemoglobin 9.2 (*) 13.0 - 17.0 (g/dL)    HCT 40.9 (*) 81.1 - 52.0 (%)    MCV 84.8  78.0 - 100.0 (fL)    MCH 26.9  26.0 - 34.0 (pg)    MCHC 31.7  30.0 - 36.0 (g/dL)    RDW 91.4 (*) 78.2 - 15.5 (%)    Platelets 302  150 - 400 (K/uL)   COMPREHENSIVE METABOLIC PANEL     Status: Abnormal   Collection Time   05/27/11  4:23 AM      Component Value Range Comment   Sodium 140  135 - 145 (mEq/L)    Potassium 3.6  3.5 - 5.1 (mEq/L)    Chloride 99  96 - 112 (mEq/L)    CO2 32   19 - 32 (mEq/L)    Glucose, Bld 77  70 - 99 (mg/dL)    BUN 9  6 - 23 (mg/dL)  Creatinine, Ser 0.65  0.50 - 1.35 (mg/dL)    Calcium 8.8  8.4 - 10.5 (mg/dL)    Total Protein 6.1  6.0 - 8.3 (g/dL)    Albumin 2.6 (*) 3.5 - 5.2 (g/dL)    AST 20  0 - 37 (U/L)    ALT 38  0 - 53 (U/L)    Alkaline Phosphatase 67  39 - 117 (U/L)    Total Bilirubin 0.5  0.3 - 1.2 (mg/dL)    GFR calc non Af Amer >90  >90 (mL/min)    GFR calc Af Amer >90  >90 (mL/min)   PRO B NATRIURETIC PEPTIDE     Status: Abnormal   Collection Time   05/27/11  4:23 AM      Component Value Range Comment   Pro B Natriuretic peptide (BNP) 1804.0 (*) 0 - 125 (pg/mL)     Ct Chest W Contrast  05/26/2011  Passage, Kamarrion L    DOB: May 10, 1950    Age at exam: 41 Y, 6   MRN: [id number]                    M   Sex: M ACC: [id number]        Exam: Thorax^CHEST_WO_ROUTINE (Adult)     Org: Conv 05/25/2011 10:48 PM  Ex. Sts: C     Report Status: Pending     Perf.                    Creation                                   Resource: APCT1  *RADIOLOGY REPORT*  Clinical Data:  COPD.  Cough, chest tightness and SOB.  CT CHEST WITH CONTRAST  Technique:  Multidetector CT imaging of the chest was performed dur ing intravenous contrast   administration.  Contrast: 80mL OMNIPAQUE IOHEXOL 300 MG/ML IJ SOLN 80 ml of omni 300  Comparison:  04/22/2011  Findings:  No enlarged supraclavicular or axillary lymph nodes.  There is a precarinal lymph node which measures 1.4 cm, image 24.  Subcarinal lymph node measures 1.2 cm, image 30.  No pericardial or pleural effusions identified.  Airspace consolidation is identified within the left lower lobe.  There are diffuse peribronchovascular areas of ground-glass attenuation and nodularity involving both lower lobes, right middle lobe and lingular portion of the left lung.  To a lesser extent the upper lobes are involved. This is a new finding compared with 04/22/2011.  Trachea appears patent and is midline.  There is mild  multilevel spondylosis identified throughout the thoracic spine.  Limited imaging through the upper abdomen is unremarkable.  IMPRESSION:  1.  Left lower lobe airspace consolidation compatible with pneumonia. 2.  Scattered nodular and ground-glass densities are seen within the remaining portions lungs consistent with multifocal infection. 3.  Enlarged precarinal lymph node is nonspecific in the setting of pneumonia.  Original Report Authenticated By: Rosealee Albee, M.D.   Dg Chest Portable 1 View  05/25/2011  *RADIOLOGY REPORT*  Clinical Data: Shortness of breath; emesis.  History of smoking.  PORTABLE CHEST - 1 VIEW  Comparison: Chest radiograph performed 10/14/2008  Findings: The lungs are well-aerated.  Vascular congestion is noted.  Diffusely increased interstitial markings are noted, more prominent at the left lung base.  The appearance raises question for mild interstitial edema, though the presence of more focal  left basilar opacity raises concern for interstitial lung disease. Alternatively, pneumonia might have a similar appearance.  There is no evidence of pleural effusion or pneumothorax.  The cardiomediastinal silhouette is within normal limits.  No acute osseous abnormalities are seen.  IMPRESSION: Diffusely increased interstitial markings, more prominent at the left lung base, with underlying vascular congestion.  Though this could reflect interstitial edema, the appearance raises question for interstitial lung disease.  Alternatively, left basilar pneumonia might have a similar appearance.  Original Report Authenticated By: Tonia Ghent, M.D.    ROS Blood pressure 102/75, pulse 76, temperature 98 F (36.7 C), temperature source Oral, resp. rate 20, height 5\' 9"  (1.753 m), weight 127 lb 3.3 oz (57.7 kg), SpO2 95.00%. Physical ExamAlert and oriented. Skin warm and dry. Oral mucosa is moist.   . Sclera anicteric, conjunctivae is pink. Thyroid not enlarged. No cervical lymphadenopathy.  Rhonchi noted both lungs.. Heart regular rate and rhythm.  Abdomen is soft. Bowel sounds are positive. No hepatomegaly. No abdominal masses felt. Slight tenderness left lower quadrant.  No edema to lower extremities. Patient is alert and oriented.   Assessment/Plan: Anemia. GI bleed. Recent EGD and colonoscopy were negative for source of bleeding.  Will obtain records from [facility]. Further recommendations once we have records. I will discuss with Dr. Karilyn Cota.  SETZER,TERRI W 05/27/2011, 8:29 AM     GI attending note; Patient interviewed and examined. I talked with his niece with his niece over the phone. Records from [facility] reviewed. He was admitted on Apr 25, 2011 and discharged on 05/06/2011. He was admitted there for respiratory failure requiring ventilatory support. Regarding his anemia and GI bleed he underwent esophagogastroduodenoscopy and colonoscopy by Dr. Lynnae Prude and no bleeding lesion was found.Marland Kitchen Hospitalization was also pertinent for C. difficile colitis. He is presently having formed stools. 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 Patient and his family are eager for him to complete evaluation which would include small bowel given capsule study. Patient will will be scheduled for Given Capsule Study  in a.m. With history of C. difficile colitis and the fact that he is on antibiotics will start him on Floraster. While at [facility] he was also evaluated by speech pathologist for his dysphagia. Patient states his dysphagia is improving. He declined to be evaluated further at this facility. At this point I do not see any indication to repeat EGD or other studies.

## 2011-05-27 NOTE — Progress Notes (Signed)
Subjective: This man was admitted yesterday with productive cough and fever. CT scan of his chest shows left lower lobe pneumonia. He has had 2 units of blood transfusion yesterday and he feels better. Family at the bedside tell me that he has had swallowing difficulties, possibly from multiple intubations in the recent past.         Physical Exam: Blood pressure 102/75, pulse 76, temperature 98 F (36.7 C), temperature source Oral, resp. rate 20, height 5\' 9"  (1.753 m), weight 57.7 kg (127 lb 3.3 oz), SpO2 95.00%. He looks chronically sick. He is pale. However, he does not have increased work of breathing. Heart sounds are present and atrial fibrillation clinically with a systolic murmur typical of aortic stenosis. Lung fields actually sound clear with no evidence of crackles, wheezes or bronchial breathing. Abdomen is soft and nontender. He is alert and orientated.   Investigations:  Recent Results (from the past 240 hour(s))  CULTURE, BLOOD (ROUTINE X 2)     Status: Normal (Preliminary result)   Collection Time   05/25/11  9:31 PM      Component Value Range Status Comment   Specimen Description BLOOD LEFT FOREARM   Final    Special Requests BOTTLES DRAWN AEROBIC AND ANAEROBIC 6CC   Final    Culture NO GROWTH 1 DAY   Final    Report Status PENDING   Incomplete   CULTURE, BLOOD (ROUTINE X 2)     Status: Normal (Preliminary result)   Collection Time   05/25/11  9:33 PM      Component Value Range Status Comment   Specimen Description BLOOD RIGHT FOREARM   Final    Special Requests BOTTLES DRAWN AEROBIC AND ANAEROBIC 5CC   Final    Culture NO GROWTH 1 DAY   Final    Report Status PENDING   Incomplete   MRSA PCR SCREENING     Status: Normal   Collection Time   05/26/11 12:53 AM      Component Value Range Status Comment   MRSA by PCR NEGATIVE  NEGATIVE  Final   CULTURE, SPUTUM-ASSESSMENT     Status: Normal   Collection Time   05/26/11  1:21 PM      Component Value Range Status  Comment   Specimen Description SPUTUM EXPECTORATED   Final    Special Requests NONE   Final    Sputum evaluation     Final    Value: THIS SPECIMEN IS ACCEPTABLE. RESPIRATORY CULTURE REPORT TO FOLLOW.     Performed at [facility]   Report Status 05/26/2011 FINAL   Final   CULTURE, RESPIRATORY     Status: Normal (Preliminary result)   Collection Time   05/26/11  1:21 PM      Component Value Range Status Comment   Specimen Description SPUTUM EXPWECTORATED   Final    Special Requests NONE   Final    Gram Stain     Final    Value: MODERATE WBC PRESENT,BOTH PMN AND MONONUCLEAR     NO SQUAMOUS EPITHELIAL CELLS SEEN     RARE GRAM POSITIVE COCCI IN PAIRS     RARE GRAM POSITIVE RODS     RARE GRAM NEGATIVE RODS   Culture PENDING   Incomplete    Report Status PENDING   Incomplete      Basic Metabolic Panel:  Basename 05/27/11 0423 05/26/11 1052  NA 140 138  K 3.6 4.0  CL 99 98  CO2 32 31  GLUCOSE 77 97  BUN 9 12  CREATININE 0.65 0.69  CALCIUM 8.8 8.9  MG -- --  PHOS -- --   Liver Function Tests:    CBC:  Basename 05/27/11 0423 05/26/11 1052 05/25/11 2126  WBC 10.9* 16.4* --  NEUTROABS -- -- 9.4*  HGB 9.2* 9.4* --  HCT 29.0* 27.9* --  MCV 84.8 83.5 --  PLT 302 279 --    Ct Chest W Contrast  05/26/2011  Ellett, Kash L    DOB: 1950/04/15    Age at exam: 61 Y, 6   MRN: [id number]                    M   Sex: M ACC: [id number]        Exam: Thorax^CHEST_WO_ROUTINE (Adult)     Org: Conv 05/25/2011 10:48 PM  Ex. Sts: C     Report Status: Pending     Perf.                    Creation                                   Resource: APCT1  *RADIOLOGY REPORT*  Clinical Data:  COPD.  Cough, chest tightness and SOB.  CT CHEST WITH CONTRAST  Technique:  Multidetector CT imaging of the chest was performed dur ing intravenous contrast   administration.  Contrast: 80mL OMNIPAQUE IOHEXOL 300 MG/ML IJ SOLN 80 ml of omni 300  Comparison:  04/22/2011  Findings:  No enlarged supraclavicular or  axillary lymph nodes.  There is a precarinal lymph node which measures 1.4 cm, image 24.  Subcarinal lymph node measures 1.2 cm, image 30.  No pericardial or pleural effusions identified.  Airspace consolidation is identified within the left lower lobe.  There are diffuse peribronchovascular areas of ground-glass attenuation and nodularity involving both lower lobes, right middle lobe and lingular portion of the left lung.  To a lesser extent the upper lobes are involved. This is a new finding compared with 04/22/2011.  Trachea appears patent and is midline.  There is mild multilevel spondylosis identified throughout the thoracic spine.  Limited imaging through the upper abdomen is unremarkable.  IMPRESSION:  1.  Left lower lobe airspace consolidation compatible with pneumonia. 2.  Scattered nodular and ground-glass densities are seen within the remaining portions lungs consistent with multifocal infection. 3.  Enlarged precarinal lymph node is nonspecific in the setting of pneumonia.  Original Report Authenticated By: Rosealee Albee, M.D.   Dg Chest Portable 1 View  05/25/2011  *RADIOLOGY REPORT*  Clinical Data: Shortness of breath; emesis.  History of smoking.  PORTABLE CHEST - 1 VIEW  Comparison: Chest radiograph performed 10/14/2008  Findings: The lungs are well-aerated.  Vascular congestion is noted.  Diffusely increased interstitial markings are noted, more prominent at the left lung base.  The appearance raises question for mild interstitial edema, though the presence of more focal left basilar opacity raises concern for interstitial lung disease. Alternatively, pneumonia might have a similar appearance.  There is no evidence of pleural effusion or pneumothorax.  The cardiomediastinal silhouette is within normal limits.  No acute osseous abnormalities are seen.  IMPRESSION: Diffusely increased interstitial markings, more prominent at the left lung base, with underlying vascular congestion.  Though this  could reflect interstitial edema, the appearance raises question for interstitial lung disease.  Alternatively, left basilar pneumonia  might have a similar appearance.  Original Report Authenticated By: Tonia Ghent, M.D.      Medications: I have reviewed the patient's current medications.  Impression: 1. Healthcare associated pneumonia. 2. Possible congestive heart failure. 3. Critical aortic stenosis. 4. Atrial fibrillation. 5. Severe anemia with Hemoccult stool positive, status post 2 units blood transfusion . 6. Bipolar disorder.     Plan: 1. Await gastroenterology opinion. 2. Swallowing evaluation by speech language pathology. 3. Cardiology consultation.      LOS: 2 days   Wilson Singer Pager [phone]  05/27/2011, 8:49 AM

## 2011-05-27 NOTE — Progress Notes (Signed)
Speech Language/Pathology  SLP attempted to complete clinical swallow evaluation, however pt adamantly refused stating that he already had a barium swallow and was fine. He refused water, apple sauce, and crackers. He reports that he did have trouble swallowing a couple of years ago, but that got better after he started taking Nexium. His nurse reports that he seems to be tolerating his meals without issue. I explained that I was likely referred to him because of his left lower lobe PNA and that a family member reported that he had difficulty swallowing. He could not be swayed.  Thank you, Havery Moros, CCC-SLP

## 2011-05-27 NOTE — Plan of Care (Signed)
Problem: Phase I Progression Outcomes Goal: Progress activity as tolerated unless otherwise ordered Outcome: Progressing Pt ambulates in room; currently using urinal, BSC and will get up to the recliner.

## 2011-05-27 NOTE — Consult Note (Signed)
ANTIBIOTIC CONSULT NOTE   Pharmacy Consult for Vancomycin, Levaquin, Cefepime Indication: rule out pneumonia  Allergies  Allergen Reactions  . Albuterol Anaphylaxis   Patient Measurements: Height: 5\' 9"  (175.3 cm) Weight: 127 lb 3.3 oz (57.7 kg) IBW/kg (Calculated) : 70.7   Vital Signs: Temp: 98 F (36.7 C) (03/21 0616) Temp src: Oral (03/21 0616) BP: 102/75 mmHg (03/21 0616) Pulse Rate: 76  (03/21 0616) Intake/Output from previous day: 03/20 0701 - 03/21 0700 In: 1014 [P.O.:360; IV Piggyback:654] Out: 1101 [Urine:1100; Stool:1] Intake/Output from this shift: Total I/O In: 160 [P.O.:160] Out: -   Labs:  Basename 05/27/11 0423 05/26/11 1052 05/25/11 1954  WBC 10.9* 16.4* 12.5*  HGB 9.2* 9.4* 6.2*  PLT 302 279 286  LABCREA -- -- --  CREATININE 0.65 0.69 0.71   Estimated Creatinine Clearance: 80.1 ml/min (by C-G formula based on Cr of 0.65).  Basename 05/27/11 0816  VANCOTROUGH 9.8*  VANCOPEAK --  Drue Dun --  GENTTROUGH --  GENTPEAK --  GENTRANDOM --  TOBRATROUGH --  Nolen Mu --  TOBRARND --  AMIKACINPEAK --  AMIKACINTROU --  AMIKACIN --    Microbiology: Recent Results (from the past 720 hour(s))  CULTURE, BLOOD (ROUTINE X 2)     Status: Normal (Preliminary result)   Collection Time   05/25/11  9:31 PM      Component Value Range Status Comment   Specimen Description BLOOD LEFT FOREARM   Final    Special Requests BOTTLES DRAWN AEROBIC AND ANAEROBIC 6CC   Final    Culture NO GROWTH 1 DAY   Final    Report Status PENDING   Incomplete   CULTURE, BLOOD (ROUTINE X 2)     Status: Normal (Preliminary result)   Collection Time   05/25/11  9:33 PM      Component Value Range Status Comment   Specimen Description BLOOD RIGHT FOREARM   Final    Special Requests BOTTLES DRAWN AEROBIC AND ANAEROBIC 5CC   Final    Culture NO GROWTH 1 DAY   Final    Report Status PENDING   Incomplete   MRSA PCR SCREENING     Status: Normal   Collection Time   05/26/11 12:53 AM        Component Value Range Status Comment   MRSA by PCR NEGATIVE  NEGATIVE  Final   CULTURE, SPUTUM-ASSESSMENT     Status: Normal   Collection Time   05/26/11  1:21 PM      Component Value Range Status Comment   Specimen Description SPUTUM EXPECTORATED   Final    Special Requests NONE   Final    Sputum evaluation     Final    Value: THIS SPECIMEN IS ACCEPTABLE. RESPIRATORY CULTURE REPORT TO FOLLOW.     Performed at [facility]   Report Status 05/26/2011 FINAL   Final   CULTURE, RESPIRATORY     Status: Normal (Preliminary result)   Collection Time   05/26/11  1:21 PM      Component Value Range Status Comment   Specimen Description SPUTUM EXPWECTORATED   Final    Special Requests NONE   Final    Gram Stain     Final    Value: MODERATE WBC PRESENT,BOTH PMN AND MONONUCLEAR     NO SQUAMOUS EPITHELIAL CELLS SEEN     RARE GRAM POSITIVE COCCI IN PAIRS     RARE GRAM POSITIVE RODS     RARE GRAM NEGATIVE RODS   Culture PENDING  Incomplete    Report Status PENDING   Incomplete    Medical History: Past Medical History  Diagnosis Date  . Bipolar disorder   . COPD (chronic obstructive pulmonary disease)     PFTs done in 11/09 show FVC of 108%, FEV! of 64%, and FEF25-25% of 30%. There was 23% improvment in the FEV1 with a bronchodilator.   . Essential hypertension, benign   . Chronic pain disorder   . Aortic stenosis     Appeared severe with a mean gradient of 42 mm of mercury and a dimensionless index to 0.24. The aorti valve itself was not well visualized.  . Bicuspid aortic valve     TEE in 11/09. Planimetered valve area was 1.1 cm sqaured witha a mean gradient 32 mmHg. No aortic coarctation. No aneurysm of thoracic aorta.  Marland Kitchen DJD (degenerative joint disease) of cervical spine   . DJD (degenerative joint disease) of lumbar spine   . Seizures     Details unclear, 2 episodes last 5 months  . Atrial fibrillation     In setting of acute illness 2/13  . Pneumonia   . Anemia     GI  bleed requiring transfusion.  . Internal hemorrhoids     Colonoscopy 2/13  . Reflux esophagitis     EGD 2/13   Medications:  Scheduled:     . amiodarone  200 mg Oral BID  . antiseptic oral rinse  15 mL Mouth Rinse BID  . busPIRone  15 mg Oral BID  . ceFEPime (MAXIPIME) IV  1 g Intravenous Q8H  . divalproex  1,500 mg Oral Daily  . feeding supplement  237 mL Oral QPC supper  . Fluticasone-Salmeterol  2 puff Inhalation Daily  . furosemide  40 mg Intravenous Daily  . gabapentin  600 mg Oral QID  . levalbuterol  0.63 mg Nebulization Q6H  . levofloxacin (LEVAQUIN) IV  750 mg Intravenous QHS  . metoprolol tartrate  12.5 mg Oral BID  . OLANZapine  25 mg Oral QHS  . pantoprazole  40 mg Oral Daily  . sodium chloride  3 mL Intravenous Q12H  . tiotropium  18 mcg Inhalation Daily  . vancomycin  1,000 mg Intravenous Q12H   Assessment: Renal fxn stable Trough below goal  Goal of Therapy:  Vancomycin trough level 15-20 mcg/ml Eradicate infection.  Plan: Increase Vancomycin 1250mg  iv q12hrs Re-Check trough at steady state Cefepime 1gm iv q8hrs levaquin 750mg  iv q24hrs Labs per protocol De-escalate ABX when appropriate  Valrie Hart A 05/27/2011,10:37 AM

## 2011-05-27 NOTE — Progress Notes (Signed)
UR Chart Review Completed  

## 2011-05-28 ENCOUNTER — Encounter (HOSPITAL_COMMUNITY)
Admission: EM | Disposition: A | Discharge status: Skilled Nursing Facility | Payer: Self-pay | Source: Home / Self Care | Attending: Internal Medicine

## 2011-05-28 DIAGNOSIS — R0609 Other forms of dyspnea: Secondary | ICD-10-CM

## 2011-05-28 HISTORY — PX: GIVENS CAPSULE STUDY: SHX5432

## 2011-05-28 LAB — CBC
MCHC: 31.6 g/dL (ref 30.0–36.0)
Platelets: 293 10*3/uL (ref 150–400)
RDW: 18.8 % — ABNORMAL HIGH (ref 11.5–15.5)

## 2011-05-28 LAB — BASIC METABOLIC PANEL
GFR calc Af Amer: >90 mL/min (ref >90)
GFR calc non Af Amer: >90 mL/min (ref >90)
Potassium: 3.6 mEq/L (ref 3.5–5.1)
Sodium: 136 mEq/L (ref 135–145)

## 2011-05-28 SURGERY — IMAGING PROCEDURE, GI TRACT, INTRALUMINAL, VIA CAPSULE

## 2011-05-28 MED ORDER — METOCLOPRAMIDE HCL 5 MG/ML IJ SOLN
10.0000 mg | Freq: Once | INTRAMUSCULAR | Status: AC
  Administered 2011-05-28: 10 mg via INTRAVENOUS
  Filled 2011-05-28: qty 2

## 2011-05-28 MED ORDER — FUROSEMIDE 40 MG PO TABS
40.0000 mg | ORAL_TABLET | Freq: Every day | ORAL | Status: DC
  Administered 2011-05-28 – 2011-05-31 (×4): 40 mg via ORAL
  Filled 2011-05-28 (×4): qty 1

## 2011-05-28 NOTE — Consult Note (Signed)
ANTIBIOTIC CONSULT NOTE   Pharmacy Consult for Vancomycin, Levaquin, Cefepime Indication: rule out pneumonia  Allergies  Allergen Reactions  . Albuterol Anaphylaxis   Patient Measurements: Height: 5\' 9"  (175.3 cm) Weight: 127 lb (57.607 kg) IBW/kg (Calculated) : 70.7   Vital Signs: Temp: 99.4 F (37.4 C) (03/22 0542) BP: 90/57 mmHg (03/22 0542) Pulse Rate: 81  (03/22 0542) Intake/Output from previous day: 03/21 0701 - 03/22 0700 In: 1443 [P.O.:640; I.V.:3; IV Piggyback:800] Out: 800 [Urine:800] Intake/Output from this shift:    Labs:  Basename 05/28/11 0429 05/27/11 0423 05/26/11 1052  WBC 11.4* 10.9* 16.4*  HGB 9.3* 9.2* 9.4*  PLT 293 302 279  LABCREA -- -- --  CREATININE 0.64 0.65 0.69   Estimated Creatinine Clearance: 80 ml/min (by C-G formula based on Cr of 0.64).  Basename 05/27/11 0816  VANCOTROUGH 9.8*  VANCOPEAK --  Drue Dun --  GENTTROUGH --  GENTPEAK --  GENTRANDOM --  TOBRATROUGH --  Nolen Mu --  TOBRARND --  AMIKACINPEAK --  AMIKACINTROU --  AMIKACIN --    Microbiology: Recent Results (from the past 720 hour(s))  CULTURE, BLOOD (ROUTINE X 2)     Status: Normal (Preliminary result)   Collection Time   05/25/11  9:31 PM      Component Value Range Status Comment   Specimen Description BLOOD LEFT FOREARM   Final    Special Requests BOTTLES DRAWN AEROBIC AND ANAEROBIC 6CC   Final    Culture NO GROWTH 3 DAYS   Final    Report Status PENDING   Incomplete   CULTURE, BLOOD (ROUTINE X 2)     Status: Normal (Preliminary result)   Collection Time   05/25/11  9:33 PM      Component Value Range Status Comment   Specimen Description BLOOD RIGHT FOREARM   Final    Special Requests BOTTLES DRAWN AEROBIC AND ANAEROBIC 5CC   Final    Culture NO GROWTH 3 DAYS   Final    Report Status PENDING   Incomplete   MRSA PCR SCREENING     Status: Normal   Collection Time   05/26/11 12:53 AM      Component Value Range Status Comment   MRSA by PCR NEGATIVE   NEGATIVE  Final   CULTURE, SPUTUM-ASSESSMENT     Status: Normal   Collection Time   05/26/11  1:21 PM      Component Value Range Status Comment   Specimen Description SPUTUM EXPECTORATED   Final    Special Requests NONE   Final    Sputum evaluation     Final    Value: THIS SPECIMEN IS ACCEPTABLE. RESPIRATORY CULTURE REPORT TO FOLLOW.     Performed at [facility]   Report Status 05/26/2011 FINAL   Final   CULTURE, RESPIRATORY     Status: Normal (Preliminary result)   Collection Time   05/26/11  1:21 PM      Component Value Range Status Comment   Specimen Description SPUTUM EXPWECTORATED   Final    Special Requests NONE   Final    Gram Stain     Final    Value: MODERATE WBC PRESENT,BOTH PMN AND MONONUCLEAR     NO SQUAMOUS EPITHELIAL CELLS SEEN     RARE GRAM POSITIVE COCCI IN PAIRS     RARE GRAM POSITIVE RODS     RARE GRAM NEGATIVE RODS   Culture PENDING   Incomplete    Report Status PENDING   Incomplete  Medical History: Past Medical History  Diagnosis Date  . Bipolar disorder   . COPD (chronic obstructive pulmonary disease)     PFTs done in 11/09 show FVC of 108%, FEV! of 64%, and FEF25-25% of 30%. There was 23% improvment in the FEV1 with a bronchodilator.   . Essential hypertension, benign   . Chronic pain disorder   . Aortic stenosis     Appeared severe with a mean gradient of 42 mm of mercury and a dimensionless index to 0.24. The aorti valve itself was not well visualized.  . Bicuspid aortic valve     TEE in 11/09. Planimetered valve area was 1.1 cm sqaured witha a mean gradient 32 mmHg. No aortic coarctation. No aneurysm of thoracic aorta.  Marland Kitchen DJD (degenerative joint disease) of cervical spine   . DJD (degenerative joint disease) of lumbar spine   . Seizures     Details unclear, 2 episodes last 5 months  . Atrial fibrillation     In setting of acute illness 2/13  . Pneumonia   . Anemia     GI bleed requiring transfusion.  . Internal hemorrhoids      Colonoscopy 2/13  . Reflux esophagitis     EGD 2/13   Medications:  Scheduled:     . amiodarone  200 mg Oral BID  . antiseptic oral rinse  15 mL Mouth Rinse BID  . busPIRone  15 mg Oral BID  . ceFEPime (MAXIPIME) IV  1 g Intravenous Q8H  . divalproex  1,500 mg Oral Daily  . feeding supplement  237 mL Oral QPC supper  . Fluticasone-Salmeterol  2 puff Inhalation Daily  . furosemide  40 mg Oral Daily  . gabapentin  600 mg Oral QID  . levalbuterol  0.63 mg Nebulization Q6H  . levofloxacin (LEVAQUIN) IV  750 mg Intravenous QHS  . metoCLOPramide (REGLAN) injection  10 mg Intravenous Once  . metoprolol tartrate  12.5 mg Oral BID  . OLANZapine  25 mg Oral QHS  . pantoprazole  40 mg Oral Daily  . saccharomyces boulardii  250 mg Oral BID  . sodium chloride  3 mL Intravenous Q12H  . tiotropium  18 mcg Inhalation Daily  . vancomycin  1,250 mg Intravenous Q12H  . DISCONTD: furosemide  40 mg Intravenous Daily  . DISCONTD: vancomycin  1,000 mg Intravenous Q12H   Assessment: Renal fxn stable  Goal of Therapy:  Vancomycin trough level 15-20 mcg/ml Eradicate infection.  Plan: Re-Check trough at tomorrow am since dose was increased Continue Cefepime 1gm iv q8hrs Continue levaquin 750mg  iv q24hrs Labs per protocol De-escalate ABX when appropriate  Valrie Hart A 05/28/2011,8:31 AM

## 2011-05-28 NOTE — Progress Notes (Signed)
Subjective: This man was admitted yesterday with productive cough and fever. CT scan of his chest shows left lower lobe pneumonia. He has had 2 units of blood transfusion and he feels better. Was seen by gastroenterology, Dr. Karilyn Cota, yesterday and he is in the process of having video capsule endoscopy. He did swallow the video capsule without any problems.        Physical Exam: Blood pressure 90/57, pulse 81, temperature 99.4 F (37.4 C), temperature source Oral, resp. rate 20, height 5\' 9"  (1.753 m), weight 57.607 kg (127 lb), SpO2 92.00%. He looks chronically sick. He is pale. However, he does not have increased work of breathing. Heart sounds are present and atrial fibrillation clinically with a systolic murmur typical of aortic stenosis. Lung fields actually sound clear with no evidence of crackles, wheezes or bronchial breathing. Abdomen is soft and nontender. He is alert and orientated.   Investigations:  Recent Results (from the past 240 hour(s))  CULTURE, BLOOD (ROUTINE X 2)     Status: Normal (Preliminary result)   Collection Time   05/25/11  9:31 PM      Component Value Range Status Comment   Specimen Description BLOOD LEFT FOREARM   Final    Special Requests BOTTLES DRAWN AEROBIC AND ANAEROBIC 6CC   Final    Culture NO GROWTH 3 DAYS   Final    Report Status PENDING   Incomplete   CULTURE, BLOOD (ROUTINE X 2)     Status: Normal (Preliminary result)   Collection Time   05/25/11  9:33 PM      Component Value Range Status Comment   Specimen Description BLOOD RIGHT FOREARM   Final    Special Requests BOTTLES DRAWN AEROBIC AND ANAEROBIC 5CC   Final    Culture NO GROWTH 3 DAYS   Final    Report Status PENDING   Incomplete   MRSA PCR SCREENING     Status: Normal   Collection Time   05/26/11 12:53 AM      Component Value Range Status Comment   MRSA by PCR NEGATIVE  NEGATIVE  Final   CULTURE, SPUTUM-ASSESSMENT     Status: Normal   Collection Time   05/26/11  1:21 PM   Component Value Range Status Comment   Specimen Description SPUTUM EXPECTORATED   Final    Special Requests NONE   Final    Sputum evaluation     Final    Value: THIS SPECIMEN IS ACCEPTABLE. RESPIRATORY CULTURE REPORT TO FOLLOW.     Performed at [facility]   Report Status 05/26/2011 FINAL   Final   CULTURE, RESPIRATORY     Status: Normal (Preliminary result)   Collection Time   05/26/11  1:21 PM      Component Value Range Status Comment   Specimen Description SPUTUM EXPWECTORATED   Final    Special Requests NONE   Final    Gram Stain     Final    Value: MODERATE WBC PRESENT,BOTH PMN AND MONONUCLEAR     NO SQUAMOUS EPITHELIAL CELLS SEEN     RARE GRAM POSITIVE COCCI IN PAIRS     RARE GRAM POSITIVE RODS     RARE GRAM NEGATIVE RODS   Culture PENDING   Incomplete    Report Status PENDING   Incomplete      Basic Metabolic Panel:  Basename 05/28/11 0429 05/27/11 0423  NA 136 140  K 3.6 3.6  CL 96 99  CO2 33* 32  GLUCOSE  83 77  BUN 13 9  CREATININE 0.64 0.65  CALCIUM 8.8 8.8  MG -- --  PHOS -- --   Liver Function Tests:    CBC:  Basename 05/28/11 0429 05/27/11 0423 05/25/11 2126  WBC 11.4* 10.9* --  NEUTROABS -- -- 9.4*  HGB 9.3* 9.2* --  HCT 29.4* 29.0* --  MCV 85.0 84.8 --  PLT 293 302 --    No results found.    Medications: I have reviewed the patient's current medications.  Impression: 1. Healthcare associated pneumonia. 2. Possible congestive heart failure. 3. Critical aortic stenosis. 4. Atrial fibrillation. 5. Severe anemia with Hemoccult stool positive, status post 2 units blood transfusion .hemoglobin stable status post blood transfusion. 6. Bipolar disorder.     Plan: 1.Await the results of video capsule endoscopy.     LOS: 3 days   Wilson Singer Pager [phone]  05/28/2011, 8:18 AM

## 2011-05-28 NOTE — Progress Notes (Signed)
Patient ID: [name], male   DOB: [birth year], 61 y.o.   MRN: [id number]   Patient Name: Wayne Guzman Date of Encounter: 05/28/2011    SUBJECTIVE  No SOB. BP is running low. Changed to po Lasix this am.  CURRENT MEDS    . amiodarone  200 mg Oral BID  . antiseptic oral rinse  15 mL Mouth Rinse BID  . busPIRone  15 mg Oral BID  . ceFEPime (MAXIPIME) IV  1 g Intravenous Q8H  . divalproex  1,500 mg Oral Daily  . feeding supplement  237 mL Oral QPC supper  . Fluticasone-Salmeterol  2 puff Inhalation Daily  . furosemide  40 mg Oral Daily  . gabapentin  600 mg Oral QID  . levalbuterol  0.63 mg Nebulization Q6H  . levofloxacin (LEVAQUIN) IV  750 mg Intravenous QHS  . metoCLOPramide (REGLAN) injection  10 mg Intravenous Once  . metoprolol tartrate  12.5 mg Oral BID  . OLANZapine  25 mg Oral QHS  . pantoprazole  40 mg Oral Daily  . saccharomyces boulardii  250 mg Oral BID  . sodium chloride  3 mL Intravenous Q12H  . tiotropium  18 mcg Inhalation Daily  . vancomycin  1,250 mg Intravenous Q12H  . DISCONTD: furosemide  40 mg Intravenous Daily  . DISCONTD: vancomycin  1,000 mg Intravenous Q12H    OBJECTIVE  Filed Vitals:   05/28/11 0310 05/28/11 0542 05/28/11 0726 05/28/11 0851  BP:  90/57    Pulse:  81    Temp:  99.4 F (37.4 C)    TempSrc:      Resp:  20    Height:   5\' 9"  (1.753 m)   Weight:   127 lb (57.607 kg)   SpO2: 97% 92%  88%    Intake/Output Summary (Last 24 hours) at 05/28/11 0919 Last data filed at 05/28/11 0246  Gross per 24 hour  Intake   1283 ml  Output    800 ml  Net    483 ml   Filed Weights   05/25/11 2041 05/26/11 0000 05/28/11 0726  Weight: 140 lb (63.504 kg) 127 lb 3.3 oz (57.7 kg) 127 lb (57.607 kg)    PHYSICAL EXAM  General: Dyspneic Neuro nonfocal  HEENT:  Normal  Neck: Supple without bruits or JVD. Lungs:  Resp regular , mildly dyspneic, CTA. Heart: RRR no s3, s4, systolic murmur Abdomen: Soft, non-tender, non-distended, BS  + x 4.  Extremities: No clubbing, cyanosis or edema. DP/PT/Radials 2+ and equal bilaterally.  Accessory Clinical Findings  CBC  Basename 05/28/11 0429 05/27/11 0423 05/25/11 2126  WBC 11.4* 10.9* --  NEUTROABS -- -- 9.4*  HGB 9.3* 9.2* --  HCT 29.4* 29.0* --  MCV 85.0 84.8 --  PLT 293 302 --   Basic Metabolic Panel  Basename 05/28/11 0429 05/27/11 0423  NA 136 140  K 3.6 3.6  CL 96 99  CO2 33* 32  GLUCOSE 83 77  BUN 13 9  CREATININE 0.64 0.65  CALCIUM 8.8 8.8  MG -- --  PHOS -- --   Liver Function Tests  Basename 05/27/11 0423 05/26/11 1052  AST 20 28  ALT 38 47  ALKPHOS 67 64  BILITOT 0.5 0.8  PROT 6.1 6.6  ALBUMIN 2.6* 2.9*   No results found for this basename: LIPASE:2,AMYLASE:2 in the last 72 hours Cardiac Enzymes  Basename 05/25/11 1954  CKTOTAL --  CKMB --  CKMBINDEX --  TROPONINI <0.30   BNP No  components found with this basename: POCBNP:3 D-Dimer No results found for this basename: DDIMER:2 in the last 72 hours Hemoglobin A1C No results found for this basename: HGBA1C in the last 72 hours Fasting Lipid Panel No results found for this basename: CHOL,HDL,LDLCALC,TRIG,CHOLHDL,LDLDIRECT in the last 72 hours Thyroid Function Tests No results found for this basename: TSH,T4TOTAL,FREET3,T3FREE,THYROIDAB in the last 72 hours  TELE  AFIB  ECG    Radiology/Studies  Ct Chest W Contrast  05/26/2011  Guzman, Wayne L    DOB: 08/13/1950    Age at exam: 42 Y, 6   MRN: [id number]                    M   Sex: M ACC: [id number]        Exam: Thorax^CHEST_WO_ROUTINE (Adult)     Org: Conv 05/25/2011 10:48 PM  Ex. Sts: C     Report Status: Pending     Perf.                    Creation                                   Resource: APCT1  *RADIOLOGY REPORT*  Clinical Data:  COPD.  Cough, chest tightness and SOB.  CT CHEST WITH CONTRAST  Technique:  Multidetector CT imaging of the chest was performed dur ing intravenous contrast   administration.  Contrast: 80mL  OMNIPAQUE IOHEXOL 300 MG/ML IJ SOLN 80 ml of omni 300  Comparison:  04/22/2011  Findings:  No enlarged supraclavicular or axillary lymph nodes.  There is a precarinal lymph node which measures 1.4 cm, image 24.  Subcarinal lymph node measures 1.2 cm, image 30.  No pericardial or pleural effusions identified.  Airspace consolidation is identified within the left lower lobe.  There are diffuse peribronchovascular areas of ground-glass attenuation and nodularity involving both lower lobes, right middle lobe and lingular portion of the left lung.  To a lesser extent the upper lobes are involved. This is a new finding compared with 04/22/2011.  Trachea appears patent and is midline.  There is mild multilevel spondylosis identified throughout the thoracic spine.  Limited imaging through the upper abdomen is unremarkable.  IMPRESSION:  1.  Left lower lobe airspace consolidation compatible with pneumonia. 2.  Scattered nodular and ground-glass densities are seen within the remaining portions lungs consistent with multifocal infection. 3.  Enlarged precarinal lymph node is nonspecific in the setting of pneumonia.  Original Report Authenticated By: Wayne Guzman, M.D.   Dg Chest Portable 1 View  05/25/2011  *RADIOLOGY REPORT*  Clinical Data: Shortness of breath; emesis.  History of smoking.  PORTABLE CHEST - 1 VIEW  Comparison: Chest radiograph performed 10/14/2008  Findings: The lungs are well-aerated.  Vascular congestion is noted.  Diffusely increased interstitial markings are noted, more prominent at the left lung base.  The appearance raises question for mild interstitial edema, though the presence of more focal left basilar opacity raises concern for interstitial lung disease. Alternatively, pneumonia might have a similar appearance.  There is no evidence of pleural effusion or pneumothorax.  The cardiomediastinal silhouette is within normal limits.  No acute osseous abnormalities are seen.  IMPRESSION: Diffusely  increased interstitial markings, more prominent at the left lung base, with underlying vascular congestion.  Though this could reflect interstitial edema, the appearance raises question for interstitial lung disease.  Alternatively, left basilar  pneumonia might have a similar appearance.  Original Report Authenticated By: Tonia Ghent, M.D.    ASSESSMENT AND PLAN  Principal Problem:  *HCAP (healthcare-associated pneumonia) Active Problems:  BIPOLAR DISORDER UNSPECIFIED  HYPERTENSION, UNSPECIFIED  Atrial fibrillation  GI bleeding  Anemia    No significant change clinically. Rate well controlled. No new recommendations.  Signed, Valera Castle MD

## 2011-05-28 NOTE — Plan of Care (Signed)
Problem: ICU Phase Progression Outcomes Goal: Pain controlled with appropriate interventions Outcome: Progressing Pt requested pain medication frequently throughout night when he would wake up, but upon entering the room, pt was sleeping deeply for most of the night.

## 2011-05-28 NOTE — Progress Notes (Signed)
Patient was able to swallow given capsule without any difficulty.. Study would be completed this afternoon and downloaded and I would read it in a.m. Patient's hemoglobin stable at 9.3

## 2011-05-28 NOTE — Progress Notes (Signed)
Clinical Social Work Department BRIEF PSYCHOSOCIAL ASSESSMENT 05/28/2011  Patient:  Wayne Guzman, Wayne Guzman     Account Number:  [id number]     Admit date:  05/25/2011  Clinical Social Worker:  Robin Searing  Date/Time:  05/28/2011 10:58 AM  Referred by:  Physician  Date Referred:  05/27/2011 Referred for  SNF Placement   Other Referral:   Interview type:  Patient Other interview type:   SPoke with patient and with SNF Rep.    PSYCHOSOCIAL DATA Living Status:  FACILITY Admitted from facility:  [facility] Level of care:  Skilled Nursing Facility Primary support name:  staff at SNF Primary support relationship to patient:  FRIEND Degree of support available:   good    CURRENT CONCERNS Current Concerns  Post-Acute Placement   Other Concerns:    SOCIAL WORK ASSESSMENT / PLAN Plan return to SNF at d/c.   Assessment/plan status:  Other - See comment Other assessment/ plan:   Plan retrun to SNF at d/c.   Information/referral to community resources:    PATIENT'S/FAMILY'S RESPONSE TO PLAN OF CARE: Patient is agreeable to this plan for return at d/c- he states he has been there for about one week and prior to this was at home. He is eager to get up and mobile and continue his SNF rehab.

## 2011-05-29 ENCOUNTER — Inpatient Hospital Stay (HOSPITAL_COMMUNITY): Payer: Medicaid Other

## 2011-05-29 DIAGNOSIS — Q2733 Arteriovenous malformation of digestive system vessel: Secondary | ICD-10-CM

## 2011-05-29 DIAGNOSIS — K598 Other specified functional intestinal disorders: Secondary | ICD-10-CM

## 2011-05-29 LAB — CBC
HCT: 29 % — ABNORMAL LOW (ref 39.0–52.0)
Hemoglobin: 9.1 g/dL — ABNORMAL LOW (ref 13.0–17.0)
MCH: 26.6 pg (ref 26.0–34.0)
MCHC: 31.4 g/dL (ref 30.0–36.0)
MCV: 84.8 fL (ref 78.0–100.0)
RDW: 19.1 % — ABNORMAL HIGH (ref 11.5–15.5)

## 2011-05-29 LAB — BASIC METABOLIC PANEL
BUN: 14 mg/dL (ref 6–23)
Calcium: 9.1 mg/dL (ref 8.4–10.5)
Creatinine, Ser: 0.64 mg/dL (ref 0.50–1.35)
GFR calc Af Amer: >90 mL/min (ref >90)
GFR calc non Af Amer: >90 mL/min (ref >90)
Glucose, Bld: 90 mg/dL (ref 70–99)
Potassium: 3.9 mEq/L (ref 3.5–5.1)

## 2011-05-29 LAB — CULTURE, RESPIRATORY W GRAM STAIN

## 2011-05-29 MED ORDER — SODIUM CHLORIDE 0.9 % IJ SOLN
INTRAMUSCULAR | Status: AC
  Administered 2011-05-29: 3 mL via INTRAVENOUS
  Filled 2011-05-29: qty 3

## 2011-05-29 MED ORDER — METOPROLOL TARTRATE 25 MG PO TABS
12.5000 mg | ORAL_TABLET | Freq: Every day | ORAL | Status: DC
  Administered 2011-05-30 – 2011-05-31 (×2): 12.5 mg via ORAL
  Filled 2011-05-29 (×2): qty 1

## 2011-05-29 MED ORDER — IOHEXOL 300 MG/ML  SOLN
100.0000 mL | Freq: Once | INTRAMUSCULAR | Status: AC | PRN
  Administered 2011-05-29: 100 mL via INTRAVENOUS

## 2011-05-29 NOTE — Op Note (Signed)
Small Bowel Givens Capsule Study Procedure date:  05/28/2011  Referring Provider:  PCP:  Dr. Laurel Dimmer Gosrani,MD Indication for procedure: GI bleed of occult origin. Patient is 61 year old Caucasian male who is undergoing therapy for recurrent pneumonia who also has required blood transfusion for recurrent anemia and GI bleed. Last month he was admitted to [facility] for pneumonia and GI bleed and had EGD and colonoscopy and old source was found to account for his GI blood loss. Procedure and risks were reviewed with the patient and informed consent obtained.    Findings:   Patient able to swallow given capsule without any difficulty. Study duration 8 hours. Food debris scattered throughout small bowel. Coffee ground material noted in jejunum starting at one hour 41 minutes and 5 seconds and continues in images through 1 hour 56 minutes and 15 seconds. At one hour 41 minutes and 9 seconds did appear as to be a foreign body. Jejunal diverticulum at 3 hours 17 minutes and 53 seconds  First Gastric image:  45 seconds First Duodenal image: 18 min, 12 secs First Ileo-Cecal Valve image: 5 hr, 27 min, 16 secs First Cecal image: 5 hr, 27 min,16 secs Gastric Passage time:  17 min Small Bowel Passage time:  5 hr 9 min  Summary & Recommendations: Suboptimal study because of food debris in small bowel but has provides useful information as follows. Coffee material in proximal to mid jejunum but no source of bleeding identified. There  is intraluminal material seen on image at 1 hr, 41 min and 09 secs suspicious for a foreign body rather than food debris. 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  Will proceed with abdominopelvic CT with contrast and determine if he needs small bowel enteroscopy. Findings and recommendations discussed with Dr. Karilyn Cota.

## 2011-05-29 NOTE — Consult Note (Signed)
ANTIBIOTIC CONSULT NOTE   Pharmacy Consult for Vancomycin, Levaquin, Cefepime Indication: rule out pneumonia  Allergies  Allergen Reactions  . Albuterol Anaphylaxis   Patient Measurements: Height: 5\' 9"  (175.3 cm) Weight: 127 lb (57.607 kg) IBW/kg (Calculated) : 70.7   Vital Signs: Temp: 98 F (36.7 C) (03/23 0514) Temp src: Oral (03/23 0514) BP: 90/61 mmHg (03/23 0514) Pulse Rate: 72  (03/23 0514) Intake/Output from previous day: 03/22 0701 - 03/23 0700 In: 1270 [P.O.:920; IV Piggyback:350] Out: 850 [Urine:850] Intake/Output from this shift:    Labs:  Basename 05/29/11 0630 05/28/11 0429 05/27/11 0423  WBC 11.6* 11.4* 10.9*  HGB 9.1* 9.3* 9.2*  PLT 291 293 302  LABCREA -- -- --  CREATININE 0.64 0.64 0.65   Estimated Creatinine Clearance: 80 ml/min (by C-G formula based on Cr of 0.64).  Basename 05/29/11 0730 05/27/11 0816  VANCOTROUGH 19.4 9.8*  VANCOPEAK -- --  Drue Dun -- --  GENTTROUGH -- --  GENTPEAK -- --  GENTRANDOM -- --  TOBRATROUGH -- --  TOBRAPEAK -- --  TOBRARND -- --  AMIKACINPEAK -- --  AMIKACINTROU -- --  AMIKACIN -- --    Microbiology: Recent Results (from the past 720 hour(s))  CULTURE, BLOOD (ROUTINE X 2)     Status: Normal (Preliminary result)   Collection Time   05/25/11  9:31 PM      Component Value Range Status Comment   Specimen Description BLOOD LEFT FOREARM   Final    Special Requests BOTTLES DRAWN AEROBIC AND ANAEROBIC 6CC   Final    Culture NO GROWTH 4 DAYS   Final    Report Status PENDING   Incomplete   CULTURE, BLOOD (ROUTINE X 2)     Status: Normal (Preliminary result)   Collection Time   05/25/11  9:33 PM      Component Value Range Status Comment   Specimen Description BLOOD RIGHT FOREARM   Final    Special Requests BOTTLES DRAWN AEROBIC AND ANAEROBIC 5CC   Final    Culture NO GROWTH 4 DAYS   Final    Report Status PENDING   Incomplete   MRSA PCR SCREENING     Status: Normal   Collection Time   05/26/11 12:53 AM      Component Value Range Status Comment   MRSA by PCR NEGATIVE  NEGATIVE  Final   CULTURE, SPUTUM-ASSESSMENT     Status: Normal   Collection Time   05/26/11  1:21 PM      Component Value Range Status Comment   Specimen Description SPUTUM EXPECTORATED   Final    Special Requests NONE   Final    Sputum evaluation     Final    Value: THIS SPECIMEN IS ACCEPTABLE. RESPIRATORY CULTURE REPORT TO FOLLOW.     Performed at [facility]   Report Status 05/26/2011 FINAL   Final   CULTURE, RESPIRATORY     Status: Normal (Preliminary result)   Collection Time   05/26/11  1:21 PM      Component Value Range Status Comment   Specimen Description SPUTUM EXPWECTORATED   Final    Special Requests NONE   Final    Gram Stain     Final    Value: MODERATE WBC PRESENT,BOTH PMN AND MONONUCLEAR     NO SQUAMOUS EPITHELIAL CELLS SEEN     RARE GRAM POSITIVE COCCI IN PAIRS     RARE GRAM POSITIVE RODS     RARE GRAM NEGATIVE RODS  Culture FEW CANDIDA ALBICANS   Final    Report Status PENDING   Incomplete    Medical History: Past Medical History  Diagnosis Date  . Bipolar disorder   . COPD (chronic obstructive pulmonary disease)     PFTs done in 11/09 show FVC of 108%, FEV! of 64%, and FEF25-25% of 30%. There was 23% improvment in the FEV1 with a bronchodilator.   . Essential hypertension, benign   . Chronic pain disorder   . Aortic stenosis     Appeared severe with a mean gradient of 42 mm of mercury and a dimensionless index to 0.24. The aorti valve itself was not well visualized.  . Bicuspid aortic valve     TEE in 11/09. Planimetered valve area was 1.1 cm sqaured witha a mean gradient 32 mmHg. No aortic coarctation. No aneurysm of thoracic aorta.  Marland Kitchen DJD (degenerative joint disease) of cervical spine   . DJD (degenerative joint disease) of lumbar spine   . Seizures     Details unclear, 2 episodes last 5 months  . Atrial fibrillation     In setting of acute illness 2/13  . Pneumonia   . Anemia       GI bleed requiring transfusion.  . Internal hemorrhoids     Colonoscopy 2/13  . Reflux esophagitis     EGD 2/13   Medications:  Scheduled:     . amiodarone  200 mg Oral BID  . antiseptic oral rinse  15 mL Mouth Rinse BID  . busPIRone  15 mg Oral BID  . ceFEPime (MAXIPIME) IV  1 g Intravenous Q8H  . divalproex  1,500 mg Oral Daily  . feeding supplement  237 mL Oral QPC supper  . Fluticasone-Salmeterol  2 puff Inhalation Daily  . furosemide  40 mg Oral Daily  . gabapentin  600 mg Oral QID  . levalbuterol  0.63 mg Nebulization Q6H  . levofloxacin (LEVAQUIN) IV  750 mg Intravenous QHS  . metoprolol tartrate  12.5 mg Oral Daily  . OLANZapine  25 mg Oral QHS  . pantoprazole  40 mg Oral Daily  . saccharomyces boulardii  250 mg Oral BID  . sodium chloride  3 mL Intravenous Q12H  . tiotropium  18 mcg Inhalation Daily  . vancomycin  1,250 mg Intravenous Q12H  . DISCONTD: metoprolol tartrate  12.5 mg Oral BID   Assessment: Renal fxn stable Vancomycin trough 19.4  Goal of Therapy:  Vancomycin trough level 15-20 mcg/ml Eradicate infection.  Plan: Continue Vancomycin 1250 mg every 12 hours Continue Cefepime 1gm iv q8hrs Continue levaquin 750mg  iv q24hrs Labs per protocol De-escalate ABX when appropriate  Wayne Guzman, Wayne Guzman 05/29/2011,10:11 AM

## 2011-05-29 NOTE — Progress Notes (Signed)
CT results reviewed with Dr. Reche Dixon; no FB identified in SB; Patient will need SB primarily to evaluate for GI Bleed. Fluid noted in esophagus ? Significance; he may have EMD; recent EGD normal

## 2011-05-29 NOTE — Progress Notes (Signed)
Subjective: This man was admitted yesterday with productive cough and fever. CT scan of his chest shows left lower lobe pneumonia. He has had 2 units of blood transfusion and he feels better. Was seen by gastroenterology, Dr. Karilyn Cota, and completed a video capsule endoscopy study. The results are pending. He is keen to mobilize.        Physical Exam: Blood pressure 90/61, pulse 72, temperature 98 F (36.7 C), temperature source Oral, resp. rate 18, height 5\' 9"  (1.753 m), weight 57.607 kg (127 lb), SpO2 96.00%. He looks chronically sick. He is pale. However, he does not have increased work of breathing. Heart sounds are present and atrial fibrillation clinically with a systolic murmur typical of aortic stenosis. Lung fields actually sound clear with no evidence of crackles, wheezes or bronchial breathing. Abdomen is soft and nontender. He is alert and orientated.   Investigations:  Recent Results (from the past 240 hour(s))  CULTURE, BLOOD (ROUTINE X 2)     Status: Normal (Preliminary result)   Collection Time   05/25/11  9:31 PM      Component Value Range Status Comment   Specimen Description BLOOD LEFT FOREARM   Final    Special Requests BOTTLES DRAWN AEROBIC AND ANAEROBIC 6CC   Final    Culture NO GROWTH 3 DAYS   Final    Report Status PENDING   Incomplete   CULTURE, BLOOD (ROUTINE X 2)     Status: Normal (Preliminary result)   Collection Time   05/25/11  9:33 PM      Component Value Range Status Comment   Specimen Description BLOOD RIGHT FOREARM   Final    Special Requests BOTTLES DRAWN AEROBIC AND ANAEROBIC 5CC   Final    Culture NO GROWTH 3 DAYS   Final    Report Status PENDING   Incomplete   MRSA PCR SCREENING     Status: Normal   Collection Time   05/26/11 12:53 AM      Component Value Range Status Comment   MRSA by PCR NEGATIVE  NEGATIVE  Final   CULTURE, SPUTUM-ASSESSMENT     Status: Normal   Collection Time   05/26/11  1:21 PM      Component Value Range Status Comment     Specimen Description SPUTUM EXPECTORATED   Final    Special Requests NONE   Final    Sputum evaluation     Final    Value: THIS SPECIMEN IS ACCEPTABLE. RESPIRATORY CULTURE REPORT TO FOLLOW.     Performed at [facility]   Report Status 05/26/2011 FINAL   Final   CULTURE, RESPIRATORY     Status: Normal (Preliminary result)   Collection Time   05/26/11  1:21 PM      Component Value Range Status Comment   Specimen Description SPUTUM EXPWECTORATED   Final    Special Requests NONE   Final    Gram Stain     Final    Value: MODERATE WBC PRESENT,BOTH PMN AND MONONUCLEAR     NO SQUAMOUS EPITHELIAL CELLS SEEN     RARE GRAM POSITIVE COCCI IN PAIRS     RARE GRAM POSITIVE RODS     RARE GRAM NEGATIVE RODS   Culture FEW CANDIDA ALBICANS   Final    Report Status PENDING   Incomplete      Basic Metabolic Panel:  Basename 05/29/11 0630 05/28/11 0429  NA 137 136  K 3.9 3.6  CL 97 96  CO2 33* 33*  GLUCOSE 90 83  BUN 14 13  CREATININE 0.64 0.64  CALCIUM 9.1 8.8  MG -- --  PHOS -- --   Liver Function Tests:    CBC:  Basename 05/29/11 0630 05/28/11 0429  WBC 11.6* 11.4*  NEUTROABS -- --  HGB 9.1* 9.3*  HCT 29.0* 29.4*  MCV 84.8 85.0  PLT 291 293    No results found.    Medications: I have reviewed the patient's current medications.  Impression: 1. Healthcare associated pneumonia. 2. Possible congestive heart failure. 3. Critical aortic stenosis. Not currently a candidate for surgery until his overall condition improves. 4. Atrial fibrillation. 5. Severe anemia with Hemoccult stool positive, status post 2 units blood transfusion .hemoglobin stable status post blood transfusion. 6. Bipolar disorder. 7. Dysphagia with modified diet.     Plan: 1.Await the results of video capsule endoscopy. 2. Mobilize today.     LOS: 4 days   Wilson Singer Pager [phone]  05/29/2011, 8:49 AM

## 2011-05-29 NOTE — Progress Notes (Signed)
Patient continues to complain of midepigastric pain. He denies nausea or vomiting. His stools are dark but not black. His abdomen is soft with mild midepigastric tenderness no organomegaly or masses noted. H&H is 9.1/29. Given capsule study results were reviewed with the patient.  Please see report for details. Assessment; Bleeding source appears to be proximal to mid jejunum no bleeding lesion identified. He has intraluminal material which appears to be foreign body and may be reason for GI bleed. Recommendations; Abdominal pelvic  CT with contrast.

## 2011-05-30 LAB — TYPE AND SCREEN
Unit division: 0
Unit division: 0

## 2011-05-30 LAB — COMPREHENSIVE METABOLIC PANEL
ALT: 31 U/L (ref 0–53)
AST: 27 U/L (ref 0–37)
Calcium: 8.9 mg/dL (ref 8.4–10.5)
Creatinine, Ser: 0.67 mg/dL (ref 0.50–1.35)
GFR calc non Af Amer: >90 mL/min (ref >90)
Sodium: 135 mEq/L (ref 135–145)
Total Protein: 6.3 g/dL (ref 6.0–8.3)

## 2011-05-30 LAB — CULTURE, BLOOD (ROUTINE X 2)

## 2011-05-30 LAB — CBC
MCH: 26.5 pg (ref 26.0–34.0)
MCHC: 31.4 g/dL (ref 30.0–36.0)
MCV: 84.6 fL (ref 78.0–100.0)
Platelets: 283 10*3/uL (ref 150–400)
RDW: 19.1 % — ABNORMAL HIGH (ref 11.5–15.5)

## 2011-05-30 MED ORDER — SODIUM CHLORIDE 0.9 % IJ SOLN
INTRAMUSCULAR | Status: AC
  Filled 2011-05-30: qty 3

## 2011-05-30 NOTE — Progress Notes (Signed)
Patient ambulated in hallways. Patient tolerated well and was able to walk without any assistance.

## 2011-05-30 NOTE — Progress Notes (Signed)
Subjective: Feels good, denies any complaints, no shortness of breath, no cough  Objective: Vital signs in last 24 hours: Temp:  [97.4 F (36.3 C)-98.1 F (36.7 C)] 98.1 F (36.7 C) (03/24 0629) Pulse Rate:  [64-99] 79  (03/24 0629) Resp:  [16-18] 18  (03/24 0629) BP: (85-112)/(52-69) 112/69 mmHg (03/24 0629) SpO2:  [90 %-95 %] 94 % (03/24 0757) FiO2 (%):  [21 %] 21 % (03/24 0757) Weight change:  Last BM Date: 05/30/11  Intake/Output from previous day: 03/23 0701 - 03/24 0700 In: 240 [P.O.:240] Out: 350 [Urine:350]     Physical Exam: General: Alert, awake, oriented x3, in no acute distress. HEENT: No bruits, no goiter. Heart: irregular Lungs: crackles at left base Abdomen: Soft, nontender, nondistended, positive bowel sounds. Extremities: No clubbing cyanosis or edema with positive pedal pulses. Neuro: Grossly intact, nonfocal.    Lab Results: Basic Metabolic Panel:  Basename 05/30/11 0742 05/29/11 0630  NA 135 137  K 4.1 3.9  CL 97 97  CO2 33* 33*  GLUCOSE 98 90  BUN 12 14  CREATININE 0.67 0.64  CALCIUM 8.9 9.1  MG -- --  PHOS -- --   Liver Function Tests:  Basename 05/30/11 0742  AST 27  ALT 31  ALKPHOS 76  BILITOT 0.4  PROT 6.3  ALBUMIN 2.6*   No results found for this basename: LIPASE:2,AMYLASE:2 in the last 72 hours No results found for this basename: AMMONIA:2 in the last 72 hours CBC:  Basename 05/30/11 0742 05/29/11 0630  WBC 9.6 11.6*  NEUTROABS -- --  HGB 9.5* 9.1*  HCT 30.3* 29.0*  MCV 84.6 84.8  PLT 283 291   Cardiac Enzymes: No results found for this basename: CKTOTAL:3,CKMB:3,CKMBINDEX:3,TROPONINI:3 in the last 72 hours BNP: No results found for this basename: PROBNP:3 in the last 72 hours D-Dimer: No results found for this basename: DDIMER:2 in the last 72 hours CBG: No results found for this basename: GLUCAP:6 in the last 72 hours Hemoglobin A1C: No results found for this basename: HGBA1C in the last 72 hours Fasting  Lipid Panel: No results found for this basename: CHOL,HDL,LDLCALC,TRIG,CHOLHDL,LDLDIRECT in the last 72 hours Thyroid Function Tests: No results found for this basename: TSH,T4TOTAL,FREET4,T3FREE,THYROIDAB in the last 72 hours Anemia Panel: No results found for this basename: VITAMINB12,FOLATE,FERRITIN,TIBC,IRON,RETICCTPCT in the last 72 hours Coagulation: No results found for this basename: LABPROT:2,INR:2 in the last 72 hours Urine Drug Screen: Drugs of Abuse  No results found for this basename: labopia, cocainscrnur, labbenz, amphetmu, thcu, labbarb    Alcohol Level: No results found for this basename: ETH:2 in the last 72 hours Urinalysis: No results found for this basename: COLORURINE:2,APPERANCEUR:2,LABSPEC:2,PHURINE:2,GLUCOSEU:2,HGBUR:2,BILIRUBINUR:2,KETONESUR:2,PROTEINUR:2,UROBILINOGEN:2,NITRITE:2,LEUKOCYTESUR:2 in the last 72 hours  Recent Results (from the past 240 hour(s))  CULTURE, BLOOD (ROUTINE X 2)     Status: Normal   Collection Time   05/25/11  9:31 PM      Component Value Range Status Comment   Specimen Description BLOOD LEFT FOREARM   Final    Special Requests BOTTLES DRAWN AEROBIC AND ANAEROBIC 6CC   Final    Culture NO GROWTH 5 DAYS   Final    Report Status 05/30/2011 FINAL   Final   CULTURE, BLOOD (ROUTINE X 2)     Status: Normal   Collection Time   05/25/11  9:33 PM      Component Value Range Status Comment   Specimen Description BLOOD RIGHT FOREARM   Final    Special Requests BOTTLES DRAWN AEROBIC AND ANAEROBIC 5CC  Final    Culture NO GROWTH 5 DAYS   Final    Report Status 05/30/2011 FINAL   Final   MRSA PCR SCREENING     Status: Normal   Collection Time   05/26/11 12:53 AM      Component Value Range Status Comment   MRSA by PCR NEGATIVE  NEGATIVE  Final   CULTURE, SPUTUM-ASSESSMENT     Status: Normal   Collection Time   05/26/11  1:21 PM      Component Value Range Status Comment   Specimen Description SPUTUM EXPECTORATED   Final    Special Requests  NONE   Final    Sputum evaluation     Final    Value: THIS SPECIMEN IS ACCEPTABLE. RESPIRATORY CULTURE REPORT TO FOLLOW.     Performed at [facility]   Report Status 05/26/2011 FINAL   Final   CULTURE, RESPIRATORY     Status: Normal   Collection Time   05/26/11  1:21 PM      Component Value Range Status Comment   Specimen Description SPUTUM EXPWECTORATED   Final    Special Requests NONE   Final    Gram Stain     Final    Value: MODERATE WBC PRESENT,BOTH PMN AND MONONUCLEAR     NO SQUAMOUS EPITHELIAL CELLS SEEN     RARE GRAM POSITIVE COCCI IN PAIRS     RARE GRAM POSITIVE RODS     RARE GRAM NEGATIVE RODS   Culture MODERATE CANDIDA ALBICANS   Final    Report Status 05/29/2011 FINAL   Final     Studies/Results: Ct Entero Abd/pelvis W/cm  05/29/2011  *RADIOLOGY REPORT*  Clinical Data:  GI bleeding.  Small bowel capsule demonstrating possible foreign body within the jejunum.  CT ABDOMEN AND PELVIS WITH CONTRAST (CT ENTEROGRAPHY)  Technique:  Multidetector CT of the abdomen and pelvis during bolus administration of intravenous contrast. Negative oral contrast VoLumen was given.  Contrast:  100  ml Omnipaque-300  Comparison:  04/22/2011 CTA  Findings: Mild motion degradation at the lung bases.  Left greater than right reticulonodular opacities.  Slightly improved appearance at the left lung base compared to 05/25/2011.  Normal heart size with fluid in the distal esophagus on image 1. No pericardial or pleural effusion.  Normal liver, spleen, stomach, pancreas, gallbladder, biliary tract, adrenal glands, kidneys.  Moderate aortic atherosclerosis. No retroperitoneal or retrocrural adenopathy.  Colonic stool burden suggests constipation.  The extent of small bowel distention with neutral contrast is moderate to good.  There is increased density within the appendix on image 136 of series 3.  Either contrast or interval appendicoliths since 04/22/2011.  Terminal ileum demonstrates solid stool like  material within.  This suggests constipation. Otherwise unremarkable.  The capsule is identified within the cecum, just inferior to the ileocecal valve.  Image 142 of series 3.  Normal small bowel caliber.  No abnormal wall thickening, mucosal hyperenhancement, or mesenteric abnormality.  A punctate focus of increased density within the enteric stream of the jejunum on image 119.  No evidence of radiopaque foreign object. No pneumatosis or free intraperitoneal air.  No pelvic adenopathy.    Normal urinary bladder and prostate.  No significant free fluid.  No acute osseous abnormality.  IMPRESSION:  1.  No acute process in the small bowel.  No evidence of unexpected foreign body.  The capsule is identified within the cecum. 2. Possible constipation. 3.  Contrast versus interval calcification within the proximal appendix.  Favor contrast, given normal appearance on 04/22/2011. No evidence of acute appendicitis. 4.  Improved bibasilar aeration.  Suspect residual aspiration or infection.  Given fluid within the esophagus, the patient may be prone to aspiration  Original Report Authenticated By: Consuello Bossier, M.D.    Medications: Scheduled Meds:   . amiodarone  200 mg Oral BID  . antiseptic oral rinse  15 mL Mouth Rinse BID  . busPIRone  15 mg Oral BID  . ceFEPime (MAXIPIME) IV  1 g Intravenous Q8H  . divalproex  1,500 mg Oral Daily  . feeding supplement  237 mL Oral QPC supper  . Fluticasone-Salmeterol  2 puff Inhalation Daily  . furosemide  40 mg Oral Daily  . gabapentin  600 mg Oral QID  . levalbuterol  0.63 mg Nebulization Q6H  . levofloxacin (LEVAQUIN) IV  750 mg Intravenous QHS  . metoprolol tartrate  12.5 mg Oral Daily  . OLANZapine  25 mg Oral QHS  . pantoprazole  40 mg Oral Daily  . saccharomyces boulardii  250 mg Oral BID  . sodium chloride  3 mL Intravenous Q12H  . tiotropium  18 mcg Inhalation Daily  . vancomycin  1,250 mg Intravenous Q12H   Continuous Infusions:  PRN  Meds:.clonazePAM, food thickener, HYDROcodone-acetaminophen, iohexol  Assessment/Plan:  Principal Problem:  *HCAP (healthcare-associated pneumonia) Active Problems:  BIPOLAR DISORDER UNSPECIFIED  HYPERTENSION, UNSPECIFIED  Atrial fibrillation  GI bleeding  Anemia  Plan:  1. HCAP, on broad spectrum antibiotics, appears to have improve, discontinue vancomycin and cefepime. 2. Critical aortic stenosis. For follow up with TCTS to consider AVR once acute issues have improved. 3. Anemia with heme + stools. S/p 2 units PRBC. Capsule endoscopy shows possible etiology of bleeding from small bowel.  CT scan of the abd/pelvis did not show any significant pathology.  May need SB enteroscopy.  Will follow up with GI 4. A fib, rate controlled, not a candidate for coumadin due to risk of bleeding. 5. Bipolar disorder 6. Dysphagia, declined any further inpatient evaluation at this facility 7. Disposition. Eventual plan is to transfer back to [facility], once GI work up is complete.     LOS: 5 days   Sanora Cunanan [facility] Pager: [phone] 05/30/2011, 10:27 AM

## 2011-05-31 MED ORDER — FUROSEMIDE 40 MG PO TABS: 20.0000 mg | ORAL_TABLET | Freq: Every day | ORAL | Status: AC

## 2011-05-31 MED ORDER — LEVOFLOXACIN 500 MG PO TABS: 750.0000 mg | ORAL_TABLET | Freq: Every day | ORAL | Status: DC

## 2011-05-31 NOTE — Progress Notes (Signed)
Patient d/c today back to [facility]- patient and family agreeable to this plan- family transporting. Reece Levy, MSW, Theresia Majors [phone]

## 2011-05-31 NOTE — Discharge Summary (Signed)
Physician Discharge Summary  Patient ID: Wayne Guzman MRN: [id number] DOB/AGE: 61-Apr-1952 60 y.o.  Admit date: 05/25/2011 Discharge date: 05/31/2011  Primary Care Physician:  Evelene Croon, MD, MD   Discharge Diagnoses:    Principal Problem:  *HCAP (healthcare-associated pneumonia) Active Problems:  BIPOLAR DISORDER UNSPECIFIED  HYPERTENSION, UNSPECIFIED  Atrial fibrillation, rate controlled, not a candidate for coumadin due to GI bleeding  GI bleeding, no clear source identified, will need small bowel enteroscopy, likely to be done at [facility]  Anemia likely due to GI blood loss, s/p 2 units PRBC Critical Aortic Stenosis, patient following with [facility] for possible AVR when bleeding issues resolved Dysphagia, patient declined further inpatient work up, on modified diet    Medication List  As of 05/31/2011  1:34 PM   STOP taking these medications         metoprolol succinate 25 MG 24 hr tablet      metroNIDAZOLE 500 MG tablet         TAKE these medications         acetaminophen 650 MG CR tablet   Commonly known as: TYLENOL   Take 650 mg by mouth every 4 (four) hours as needed. For pain      ADVAIR DISKUS 250-50 MCG/DOSE Aepb   Generic drug: Fluticasone-Salmeterol   Inhale 2 puffs into the lungs daily.      amiodarone 200 MG tablet   Commonly known as: PACERONE   Take 200 mg by mouth 2 (two) times daily.      busPIRone 15 MG tablet   Commonly known as: BUSPAR   Take 15 mg by mouth 2 (two) times daily.      clonazePAM 1 MG tablet   Commonly known as: KLONOPIN   Take 1 mg by mouth 2 (two) times daily as needed. For anxiety      divalproex 500 MG DR tablet   Commonly known as: DEPAKOTE   Take 1,500 mg by mouth daily.      esomeprazole 40 MG capsule   Commonly known as: NEXIUM   Take 40 mg by mouth daily before breakfast.      furosemide 40 MG tablet   Commonly known as: LASIX   Take 0.5 tablets (20 mg total) by mouth daily.      gabapentin 600 MG tablet     Commonly known as: NEURONTIN   Take 600 mg by mouth 4 (four) times daily.      LORTAB 7.5 7.5-500 MG per tablet   Generic drug: HYDROcodone-acetaminophen   Take 1 tablet by mouth every 6 (six) hours as needed. For pain      metoprolol tartrate 25 MG tablet   Commonly known as: LOPRESSOR   Take 25 mg by mouth 2 (two) times daily.      OLANZapine 10 MG tablet   Commonly known as: ZYPREXA   Take 25 mg by mouth at bedtime.      tiotropium 18 MCG inhalation capsule   Commonly known as: SPIRIVA   Place 18 mcg into inhaler and inhale daily.           Discharge Exam: Blood pressure 97/66, pulse 109, temperature 98.1 F (36.7 C), temperature source Oral, resp. rate 20, height 5\' 9"  (1.753 m), weight 57.607 kg (127 lb), SpO2 94.00%. NAD Crackles at left base Irregular rate and rhythm Soft, NT, BS+ No edema b/l   Disposition and Follow-up:  Patient will be set up to see Dr. Gwinda Passe at [facility] for further small  bowel enteroscopy He can follow up with [facility] as scheduled Follow up with MD at SNF  Consults: GI, Dr. Karilyn Cota Cardiology, Dr. Dietrich Pates   Significant Diagnostic Studies:  Ct Chest W Contrast  05/26/2011  Acre, Nile L    DOB: [birth year]    Age at exam: 37 Y, 6   MRN: [id number]                    M   Sex: M ACC: [id number]        Exam: Thorax^CHEST_WO_ROUTINE (Adult)     Org: Conv 05/25/2011 10:48 PM  Ex. Sts: C     Report Status: Pending     Perf.                    Creation                                   Resource: APCT1  *RADIOLOGY REPORT*  Clinical Data:  COPD.  Cough, chest tightness and SOB.  CT CHEST WITH CONTRAST  Technique:  Multidetector CT imaging of the chest was performed dur ing intravenous contrast   administration.  Contrast: 80mL OMNIPAQUE IOHEXOL 300 MG/ML IJ SOLN 80 ml of omni 300  Comparison:  04/22/2011  Findings:  No enlarged supraclavicular or axillary lymph nodes.  There is a precarinal lymph node which measures 1.4 cm, image 24.  Subcarinal lymph node  measures 1.2 cm, image 30.  No pericardial or pleural effusions identified.  Airspace consolidation is identified within the left lower lobe.  There are diffuse peribronchovascular areas of ground-glass attenuation and nodularity involving both lower lobes, right middle lobe and lingular portion of the left lung.  To a lesser extent the upper lobes are involved. This is a new finding compared with 04/22/2011.  Trachea appears patent and is midline.  There is mild multilevel spondylosis identified throughout the thoracic spine.  Limited imaging through the upper abdomen is unremarkable.  IMPRESSION:  1.  Left lower lobe airspace consolidation compatible with pneumonia. 2.  Scattered nodular and ground-glass densities are seen within the remaining portions lungs consistent with multifocal infection. 3.  Enlarged precarinal lymph node is nonspecific in the setting of pneumonia.  Original Report Authenticated By: Rosealee Albee, M.D.   Dg Chest Portable 1 View  05/25/2011  *RADIOLOGY REPORT*  Clinical Data: Shortness of breath; emesis.  History of smoking.  PORTABLE CHEST - 1 VIEW  Comparison: Chest radiograph performed 10/14/2008  Findings: The lungs are well-aerated.  Vascular congestion is noted.  Diffusely increased interstitial markings are noted, more prominent at the left lung base.  The appearance raises question for mild interstitial edema, though the presence of more focal left basilar opacity raises concern for interstitial lung disease. Alternatively, pneumonia might have a similar appearance.  There is no evidence of pleural effusion or pneumothorax.  The cardiomediastinal silhouette is within normal limits.  No acute osseous abnormalities are seen.  IMPRESSION: Diffusely increased interstitial markings, more prominent at the left lung base, with underlying vascular congestion.  Though this could reflect interstitial edema, the appearance raises question for interstitial lung disease.  Alternatively,  left basilar pneumonia might have a similar appearance.  Original Report Authenticated By: Tonia Ghent, M.D.    Brief H and P: For complete details please refer to admission H and P, but in brief This is a 61 year old, Caucasian male, with a past medical  history of aortic stenosis, hypertension, atrial fibrillation, but not on anticoagulation, bipolar disorder, and COPD, who lives at a local nursing home. Patient was recently discharged from [facility] about 9-10 days ago. He apparently, had pneumonia, and GI bleeding. He, apparently, underwent colonoscopy and upper endoscopy. No reason was found for his bleeding.  Patient tells me for the last few days he's been feeling very weak. Has had coughing with yellowish expectoration. Has had shortness of breath. Denies any leg swelling. Has minimal amount of chest pain, especially when he has to take a deep breath. Otherwise, none. Denies any nausea, vomiting. Has had nonspecific abdominal pain for last 4 weeks, but none currently. Denies any diarrhea. He tells me that he had a normal bowel movement yesterday and did not notice any blood. Denies any black colored stools. Has lost some weight. He is unable to tell me, if he's had a capsule study done recently. Denies any fever or chills.  He tells me has a history of aortic stenosis, and is being considered for surgical intervention.     Hospital Course:  This gentleman was admitted to the hospital with shortness of breath which was felt to be due to pneumonia.  He was started on  Broad spectrum antibiotics and has had a significant improvement. Patient is not febrile, has a normal WBC count. He no longer feels shortness of breath and has had a productive cough. He is ambulating on room air without any difficulties. He is complete a total of 7 days of antibiotics in the hospital. He was evaluated by speech therapy for dysphagia, but he denied any inpatient workup here in this hospital. He  reports that he has been worked up for this in the past. Review of records from nursing home show that he was on a nectar thick liquid diet with regular consistency food. We will continue this and he was advised regarding aspiration precautions.  Patient was noted to be severely anemic with a hemoglobin of 6.2. He was also noted to have heme positive stools. He had recently had a upper endoscopy and colonoscopy which were unrevealing. He was seen in consultation by Dr. Karilyn Cota from gastroenterology. He underwent capsule endoscopy which was suboptimal study because of food debris and small bowel. It was noted that patient had a coffee material in proximal and mid jejunum, but no source of bleeding was identified. This was further followed up with a CT enterography, which showed no acute process in the small bowel. It was felt that patient will need further evaluation with small bowel enteroscopy. Unfortunately this cannot be done at our facility. Patient will be set up to be seen at [facility] for further evaluation of this. He was transfused 2 units of PRBCs and since then his hemoglobin has remained stable. He has no visible signs of bleeding. It is felt that this procedure can be done as an outpatient. Dr. Karilyn Cota agrees with this.  Patient is also noted the upper cleric stenosis on echocardiogram. He was seen in consultation by cardiology. He is followed by cardiothoracic surgery as an outpatient and is being considered for valve replacement surgery. Of course his current bleeding issues will need to be dressed prior to any surgery which makes further evaluation with small bowel enteroscopy very important. He is not a candidate for Coumadin at this time due to bleeding issues. His atrial fibrillation is rate controlled.  Patient was discharged back to nursing facility today for further care  Time  spent on Discharge:  Signed: Eola Waldrep [facility] Pager:  [phone] 05/31/2011, 1:34 PM

## 2011-05-31 NOTE — Progress Notes (Signed)
Report called to Pat at [facility]. Pt discharged from floor by wheelchair accompanied by family and staff.

## 2011-05-31 NOTE — Progress Notes (Signed)
PHARMACIST - PHYSICIAN COMMUNICATION DR:   Memon CONCERNING: Antibiotic IV to Oral Route Change Policy  RECOMMENDATION: This patient is receiving Levaquin by the intravenous route.  Based on criteria approved by the Pharmacy and Therapeutics Committee, the antibiotic(s) is/are being converted to the equivalent oral dose form(s).  DESCRIPTION: These criteria include:  Patient being treated for a respiratory tract infection, urinary tract infection, or cellulitis  The patient is not neutropenic and does not exhibit a GI malabsorption state  The patient is eating (either orally or via tube) and/or has been taking other orally administered medications for a least 24 hours  The patient is improving clinically and has a Tmax < 100.5  If you have questions about this conversion, please contact the Pharmacy Department  [x]  ( [phone] )  [facility] []  ( [phone] )  [facility]  []  ( [phone] )  [facility] []  ( [phone] )  [facility]   S. Troy Kanouse, PharmD  

## 2011-06-01 NOTE — Progress Notes (Signed)
Discharge summary sent to [facility]  

## 2011-06-03 ENCOUNTER — Encounter (HOSPITAL_COMMUNITY): Payer: Self-pay | Admitting: Internal Medicine

## 2011-06-11 ENCOUNTER — Ambulatory Visit (INDEPENDENT_AMBULATORY_CARE_PROVIDER_SITE_OTHER): Payer: Medicaid Other

## 2011-06-11 ENCOUNTER — Other Ambulatory Visit: Payer: Self-pay

## 2011-06-11 DIAGNOSIS — R0989 Other specified symptoms and signs involving the circulatory and respiratory systems: Secondary | ICD-10-CM

## 2011-06-11 LAB — CBC WITH DIFFERENTIAL/PLATELET
Basophil #: 0.1 10*3/uL (ref 0.0–0.1)
Basophil %: 0.7 %
Eosinophil #: 0 10*3/uL (ref 0.0–0.7)
HCT: 31.8 % — ABNORMAL LOW (ref 40.0–52.0)
HGB: 9.9 g/dL — ABNORMAL LOW (ref 13.0–18.0)
Lymphocyte %: 16.6 %
MCH: 25.4 pg — ABNORMAL LOW (ref 26.0–34.0)
MCV: 81 fL (ref 80–100)
Monocyte #: 1.4 10*3/uL — ABNORMAL HIGH (ref 0.0–0.7)
Monocyte %: 18.1 %
Neutrophil #: 4.9 10*3/uL (ref 1.4–6.5)
Neutrophil %: 64.2 %
Platelet: 334 10*3/uL (ref 150–440)
RBC: 3.91 10*6/uL — ABNORMAL LOW (ref 4.40–5.90)
RDW: 20.4 % — ABNORMAL HIGH (ref 11.5–14.5)
WBC: 7.7 10*3/uL (ref 3.8–10.6)

## 2011-06-11 LAB — BASIC METABOLIC PANEL
BUN: 9 mg/dL (ref 7–18)
Calcium, Total: 8.7 mg/dL (ref 8.5–10.1)
Co2: 26 mmol/L (ref 21–32)
Creatinine: 1.1 mg/dL (ref 0.60–1.30)
EGFR (Non-African Amer.): >60

## 2011-06-14 NOTE — Progress Notes (Signed)
Patient took labs to [facility].

## 2011-06-15 ENCOUNTER — Encounter: Payer: Self-pay | Admitting: Cardiovascular Disease

## 2011-06-15 ENCOUNTER — Ambulatory Visit (INDEPENDENT_AMBULATORY_CARE_PROVIDER_SITE_OTHER): Payer: Medicaid Other | Admitting: Cardiovascular Disease

## 2011-06-15 VITALS — BP 90/62 | HR 56 | Ht 69.5 in | Wt 129.8 lb

## 2011-06-15 DIAGNOSIS — I10 Essential (primary) hypertension: Secondary | ICD-10-CM

## 2011-06-15 DIAGNOSIS — I4891 Unspecified atrial fibrillation: Secondary | ICD-10-CM

## 2011-06-15 DIAGNOSIS — I6529 Occlusion and stenosis of unspecified carotid artery: Secondary | ICD-10-CM

## 2011-06-15 DIAGNOSIS — I771 Stricture of artery: Secondary | ICD-10-CM

## 2011-06-15 DIAGNOSIS — D649 Anemia, unspecified: Secondary | ICD-10-CM

## 2011-06-15 DIAGNOSIS — I359 Nonrheumatic aortic valve disorder, unspecified: Secondary | ICD-10-CM

## 2011-06-15 DIAGNOSIS — J449 Chronic obstructive pulmonary disease, unspecified: Secondary | ICD-10-CM

## 2011-06-15 MED ORDER — METOPROLOL TARTRATE 25 MG PO TABS: 25.0000 mg | ORAL_TABLET | Freq: Two times a day (BID) | ORAL | Status: AC

## 2011-06-15 NOTE — Assessment & Plan Note (Addendum)
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 Previous carotid showing 50 mm brachial artery pressure gradient.

## 2011-06-15 NOTE — Assessment & Plan Note (Signed)
He is maintaining normal sinus rhythm. He may benefit from maze procedure at the time of surgery if possible. We'll keep him on amiodarone 200 mg twice a day until after surgery.

## 2011-06-15 NOTE — Assessment & Plan Note (Signed)
Recent pneumonia and COPD exacerbation. Currently appears to be stable.

## 2011-06-15 NOTE — Progress Notes (Signed)
Patient ID: Wayne Guzman, male    DOB: 1950-08-29, 61 y.o.   MRN: [id number]  HPI Comments: Wayne Guzman is a 61 y.o.male with bicuspid aortic valve with severe stenosis,  recent hospital admission at [facility] with pneumonia, atrial fibrillation, anemia in the setting of GI bleeding, agitation,  initiated on amiodarone, had been on Xarelto although this was discontinued in the setting of his GI bleeding. He presents today for routine followup.  He was very weak after his pneumonia earlier this year. He spent a long time at rehabilitation. He lost 30 pounds and is now trying to regain some weight. He still requires some assistance at home for dressing and ADLs. He had a recent fall while putting his pants on and cut his face. He denies any significant chest discomfort. He has not noticed any melena or hematuria. He has followup with GI this week. He had a capsule endoscopy, recent abdominal CT scan.   Recent lab work shows hemoglobin of 9.9.  He reports that he is taking iron. He does have very mild shortness of breath with exertion. He has not been very reactive at baseline in his recovery. He was seen by Dr. Diona Browner several weeks ago and at that time was improving in rehabilitation.  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 50 mm brachial artery pressure gradient.  He does not have any significant arm weakness on the left at this time.  EKG shows normal sinus rhythm with rate 55 beats per minute, no significant ST or T wave changes   Outpatient Encounter Prescriptions as of 06/15/2011  Medication Sig Dispense Refill  . acetaminophen (TYLENOL) 650 MG CR tablet Take 650 mg by mouth every 4 (four) hours as needed. For pain      . amiodarone (PACERONE) 200 MG tablet Take 200 mg by mouth 2 (two) times daily.      . busPIRone (BUSPAR) 15 MG tablet Take 15 mg by mouth 2 (two) times  daily.      . clonazePAM (KLONOPIN) 1 MG tablet Take 1 mg by mouth 2 (two) times daily as needed. For anxiety      . divalproex (DEPAKOTE) 500 MG EC tablet Take 1,500 mg by mouth daily.       Marland Kitchen esomeprazole (NEXIUM) 40 MG capsule Take 40 mg by mouth daily before breakfast.      . Fluticasone-Salmeterol (ADVAIR DISKUS) 250-50 MCG/DOSE AEPB Inhale 2 puffs into the lungs daily.       . furosemide (LASIX) 40 MG tablet Take 0.5 tablets (20 mg total) by mouth daily.  30 tablet    . gabapentin (NEURONTIN) 600 MG tablet Take 600 mg by mouth 4 (four) times daily.      . metoprolol tartrate (LOPRESSOR) 25 MG tablet Take 1 tablet (25 mg total) by mouth 2 (two) times daily.  180 tablet  3  . OLANZapine (ZYPREXA) 10 MG tablet Take 25 mg by mouth at bedtime.       Marland Kitchen tiotropium (SPIRIVA) 18 MCG inhalation capsule Place 18 mcg into inhaler and inhale daily.        Review of Systems  Constitutional: Positive for fatigue and unexpected weight change.  HENT: Negative.   Eyes: Negative.   Respiratory: Positive for shortness of breath.   Cardiovascular: Negative.   Gastrointestinal: Negative.   Musculoskeletal: Negative.   Skin: Negative.   Neurological: Positive for weakness.  Hematological: Negative.  Psychiatric/Behavioral: Negative.   All other systems reviewed and are negative.    BP 90/62  Pulse 56  Ht 5' 9.5" (1.765 m)  Wt 129 lb 12.8 oz (58.877 kg)  BMI 18.89 kg/m2  Physical Exam  Nursing note and vitals reviewed. Constitutional: He is oriented to person, place, and time. He appears well-developed and well-nourished.  HENT:  Head: Normocephalic.  Nose: Nose normal.  Mouth/Throat: Oropharynx is clear and moist.  Eyes: Conjunctivae are normal. Pupils are equal, round, and reactive to light.  Neck: Normal range of motion. Neck supple. No JVD present.  Cardiovascular: Normal rate, regular rhythm, S1 normal, S2 normal, normal heart sounds and intact distal pulses.  Exam reveals no gallop and  no friction rub.   No murmur heard. Pulmonary/Chest: Effort normal and breath sounds normal. No respiratory distress. He has no wheezes. He has no rales. He exhibits no tenderness.  Abdominal: Soft. Bowel sounds are normal. He exhibits no distension. There is no tenderness.  Musculoskeletal: Normal range of motion. He exhibits no edema and no tenderness.  Lymphadenopathy:    He has no cervical adenopathy.  Neurological: He is alert and oriented to person, place, and time. Coordination normal.  Skin: Skin is warm and dry. No rash noted. No erythema.  Psychiatric: He has a normal mood and affect. His behavior is normal. Judgment and thought content normal.           Assessment and Plan

## 2011-06-15 NOTE — Assessment & Plan Note (Addendum)
He is scheduled to see Dr.  Cornelius Moras in [place] next week . He feels weak, has lost significant weight . When I asked him if he was ready for surgery, he has indicated that if possible, he would like to wait for his weight to improve in his strength to return back to baseline . He would be a better candidate for bioprosthetic valve given the recent GI bleeding issues.

## 2011-06-15 NOTE — Assessment & Plan Note (Signed)
Appears to be relatively stable. He has followup with GI is weak. Recent capsule endoscopy. We'll try to maintain normal sinus rhythm to avoid anticoagulation in the future.

## 2011-06-15 NOTE — Assessment & Plan Note (Signed)
Severe ostial left subclavian artery stenosis on carotid ultrasound September 2012. I would suggest fixing his aortic valve first prior to intervention of the ostial left subclavian artery which may require high-dose aspirin and Plavix.

## 2011-06-15 NOTE — Patient Instructions (Signed)
You are doing well. Keep taking your metoprolol  Please call us if you have new issues that need to be addressed before your next appt.  Your physician wants you to follow-up in: 6 months.  You will receive a reminder letter in the mail two months in advance. If you don't receive a letter, please call our office to schedule the follow-up appointment.

## 2011-06-15 NOTE — Assessment & Plan Note (Signed)
40% carotid disease on the left with ostial left subclavian arterial stenosis

## 2011-06-18 ENCOUNTER — Other Ambulatory Visit: Payer: Self-pay | Admitting: Gastroenterology

## 2011-06-18 LAB — CLOSTRIDIUM DIFFICILE BY PCR

## 2011-06-21 ENCOUNTER — Emergency Department: Payer: Self-pay | Admitting: *Deleted

## 2011-06-21 ENCOUNTER — Ambulatory Visit: Payer: Medicaid Other | Admitting: Thoracic Surgery (Cardiothoracic Vascular Surgery)

## 2011-06-21 ENCOUNTER — Emergency Department: Payer: Self-pay | Admitting: Emergency Medicine

## 2011-06-21 LAB — COMPREHENSIVE METABOLIC PANEL
Alkaline Phosphatase: 63 U/L (ref 50–136)
Anion Gap: 8 (ref 7–16)
Calcium, Total: 9.2 mg/dL (ref 8.5–10.1)
Creatinine: 0.75 mg/dL (ref 0.60–1.30)
EGFR (African American): >60
Osmolality: 275 (ref 275–301)
Potassium: 4.4 mmol/L (ref 3.5–5.1)
SGPT (ALT): 27 U/L
Sodium: 139 mmol/L (ref 136–145)

## 2011-06-21 LAB — URINALYSIS, COMPLETE
Bilirubin,UR: NEGATIVE
Leukocyte Esterase: NEGATIVE
Nitrite: NEGATIVE
Ph: 5 (ref 4.5–8.0)
Squamous Epithelial: 1

## 2011-06-21 LAB — CBC
HCT: 30.9 % — ABNORMAL LOW (ref 40.0–52.0)
MCH: 23.5 pg — ABNORMAL LOW (ref 26.0–34.0)
MCV: 77 fL — ABNORMAL LOW (ref 80–100)
RBC: 4.02 10*6/uL — ABNORMAL LOW (ref 4.40–5.90)
WBC: 7.4 10*3/uL (ref 3.8–10.6)

## 2011-06-21 LAB — LIPASE, BLOOD: Lipase: 73 U/L (ref 73–393)

## 2011-07-01 ENCOUNTER — Emergency Department: Payer: Self-pay | Admitting: Emergency Medicine

## 2011-07-01 LAB — COMPREHENSIVE METABOLIC PANEL
Albumin: 3.6 g/dL (ref 3.4–5.0)
Alkaline Phosphatase: 58 U/L (ref 50–136)
Anion Gap: 9 (ref 7–16)
BUN: 8 mg/dL (ref 7–18)
Bilirubin,Total: 0.3 mg/dL (ref 0.2–1.0)
Co2: 27 mmol/L (ref 21–32)
Creatinine: 0.85 mg/dL (ref 0.60–1.30)
EGFR (Non-African Amer.): >60
Glucose: 98 mg/dL (ref 65–99)
Potassium: 4.2 mmol/L (ref 3.5–5.1)
SGOT(AST): 17 U/L (ref 15–37)
Sodium: 136 mmol/L (ref 136–145)
Total Protein: 7.5 g/dL (ref 6.4–8.2)

## 2011-07-01 LAB — CBC
HCT: 33 % — ABNORMAL LOW (ref 40.0–52.0)
MCH: 22.7 pg — ABNORMAL LOW (ref 26.0–34.0)
MCHC: 30.9 g/dL — ABNORMAL LOW (ref 32.0–36.0)
MCV: 74 fL — ABNORMAL LOW (ref 80–100)
Platelet: 292 10*3/uL (ref 150–440)
RDW: 20.3 % — ABNORMAL HIGH (ref 11.5–14.5)
WBC: 8.4 10*3/uL (ref 3.8–10.6)

## 2011-07-01 LAB — LIPASE, BLOOD: Lipase: 77 U/L (ref 73–393)

## 2011-07-02 ENCOUNTER — Ambulatory Visit: Payer: Self-pay | Admitting: Gastroenterology

## 2011-07-07 ENCOUNTER — Other Ambulatory Visit: Payer: Self-pay | Admitting: Gastroenterology

## 2011-08-31 ENCOUNTER — Other Ambulatory Visit: Payer: Self-pay | Admitting: Gastroenterology

## 2011-09-01 ENCOUNTER — Emergency Department: Payer: Self-pay | Admitting: *Deleted

## 2011-09-02 ENCOUNTER — Telehealth: Payer: Self-pay | Admitting: Cardiovascular Disease

## 2011-09-02 ENCOUNTER — Inpatient Hospital Stay: Payer: Self-pay | Admitting: Internal Medicine

## 2011-09-02 LAB — COMPREHENSIVE METABOLIC PANEL
Albumin: 3.7 g/dL (ref 3.4–5.0)
Bilirubin,Total: 0.3 mg/dL (ref 0.2–1.0)
Calcium, Total: 9.2 mg/dL (ref 8.5–10.1)
Chloride: 99 mmol/L (ref 98–107)
Creatinine: 0.87 mg/dL (ref 0.60–1.30)
EGFR (African American): >60
EGFR (Non-African Amer.): >60
Glucose: 87 mg/dL (ref 65–99)
Osmolality: 270 (ref 275–301)
Potassium: 4.1 mmol/L (ref 3.5–5.1)
SGOT(AST): 23 U/L (ref 15–37)
SGPT (ALT): 19 U/L
Sodium: 136 mmol/L (ref 136–145)

## 2011-09-02 LAB — CBC
HCT: 33.7 % — ABNORMAL LOW (ref 40.0–52.0)
MCH: 22 pg — ABNORMAL LOW (ref 26.0–34.0)
MCV: 73 fL — ABNORMAL LOW (ref 80–100)
Platelet: 342 10*3/uL (ref 150–440)
RDW: 21.7 % — ABNORMAL HIGH (ref 11.5–14.5)
WBC: 20.2 10*3/uL — ABNORMAL HIGH (ref 3.8–10.6)

## 2011-09-02 LAB — DRUG SCREEN, URINE
Barbiturates, Ur Screen: NEGATIVE (ref ?–200)
Benzodiazepine, Ur Scrn: NEGATIVE (ref ?–200)
Cannabinoid 50 Ng, Ur ~~LOC~~: NEGATIVE (ref ?–50)
Cocaine Metabolite,Ur ~~LOC~~: NEGATIVE (ref ?–300)
MDMA (Ecstasy)Ur Screen: NEGATIVE (ref ?–500)

## 2011-09-02 LAB — URINALYSIS, COMPLETE
Bilirubin,UR: NEGATIVE
Blood: NEGATIVE
Glucose,UR: NEGATIVE mg/dL (ref 0–75)
Hyaline Cast: 7
Ketone: NEGATIVE
Leukocyte Esterase: NEGATIVE
Nitrite: NEGATIVE
Protein: 30
RBC,UR: 1 /HPF (ref 0–5)
Specific Gravity: 1.026 (ref 1.003–1.030)
WBC UR: 5 /HPF (ref 0–5)

## 2011-09-02 LAB — STOOL CULTURE

## 2011-09-02 NOTE — Telephone Encounter (Signed)
Pt was seen at [facility] for low BP. Wants to speak to nurse about this

## 2011-09-02 NOTE — Telephone Encounter (Signed)
Pt says his BP was low yesterday 86/70.  He went to ER and was dx with Cdiff.  He was started on Vancomycin.  This am he says BP=129/79, but left side of face/left eye is swollen.  He thinks he needs to be seen.  I discussed with Dr. Mariah Milling, who says pt does not need to be seen.  He has consulted with Dr. Cornelius Moras for possible valve replacement surg.  He should push fluids/salt d/t severe AS. B/C of Cdiff he may be dehydrated.  This will wosren his valve issues if dehydrated.  He also suggested he f/u with PCP re: facial edema, since it may be a rxn to abx.  I explained this to pt who verb. Understanding.  He will also call Dr. Orvan July office to check status of surg to be scheduled.

## 2011-09-04 LAB — CBC WITH DIFFERENTIAL/PLATELET
Basophil #: 0.1 10*3/uL (ref 0.0–0.1)
Eosinophil #: 0.8 10*3/uL — ABNORMAL HIGH (ref 0.0–0.7)
Eosinophil %: 6.4 %
HGB: 8.4 g/dL — ABNORMAL LOW (ref 13.0–18.0)
Lymphocyte #: 2.3 10*3/uL (ref 1.0–3.6)
MCH: 22.8 pg — ABNORMAL LOW (ref 26.0–34.0)
MCHC: 30.9 g/dL — ABNORMAL LOW (ref 32.0–36.0)
Monocyte %: 9.5 %
Neutrophil #: 7.4 10*3/uL — ABNORMAL HIGH (ref 1.4–6.5)
Platelet: 263 10*3/uL (ref 150–440)
RBC: 3.69 10*6/uL — ABNORMAL LOW (ref 4.40–5.90)
WBC: 11.7 10*3/uL — ABNORMAL HIGH (ref 3.8–10.6)

## 2011-09-06 LAB — CREATININE, SERUM
Creatinine: 0.87 mg/dL (ref 0.60–1.30)
EGFR (African American): >60

## 2011-09-06 LAB — VANCOMYCIN, TROUGH: Vancomycin, Trough: 24 ug/mL (ref 10–20)

## 2011-09-07 LAB — WOUND CULTURE

## 2011-09-08 LAB — CULTURE, BLOOD (SINGLE)

## 2011-09-16 ENCOUNTER — Telehealth: Payer: Self-pay

## 2011-09-16 NOTE — Telephone Encounter (Signed)
Pt calling c/o low BP (77/50, 89/50). Says he is dizzy and sob.  He is still recovering from [facility] dx during recent hospitalization a week or two ago.  He denies further diarrhea, confirms he is drinking plenty of fluid and ate some soup today.  He is currently lying down on the couch at his home. I had him hold while I discussed with dr. Mariah Milling.  Dr. Mariah Milling encourages pt to push salt and fluids tonight and call us tomm am.  The reason his pressure is low is d/t his severe AS for which he is awaiting surgery for. I advised pt to push PO fluids and salt tonight and call us in the am to tell us how he is feeling.  He confirms he is no longer taking lasix/metoprolol.  He will try pushing fluids and call us in the am.  In the meantime he asks if he should keep appt with infectious disease MD tomm. I strongly advised him to keep this appt since they may be able to advise him on when he can have AV surg.  Understanding verb.

## 2011-09-17 ENCOUNTER — Telehealth: Payer: Self-pay | Admitting: Cardiovascular Disease

## 2011-09-17 NOTE — Telephone Encounter (Signed)
Pt called concerning that his BP is still low. Said he drank salt water and didn't help

## 2011-09-17 NOTE — Telephone Encounter (Signed)
Pt called back to say he has been drinking plenty of fluids and eating lots of salt.  BP is still low (67/63) and he continues to feel dizzy and sob.I advised him to hold while I confirmed with Dr. Mariah Milling what he should do.

## 2011-09-17 NOTE — Telephone Encounter (Signed)
Discussed with Dr. Mariah Milling who says pt needs to go to ER. i gave the pt these instructions.  He is alone and does not have anyone to take him. I urged him to call 911 and DO NOT DRIVE d/t his dizziness and low BP He voices understanding and will call 911 now

## 2011-10-07 DEATH — deceased

## 2013-06-26 IMAGING — CR DG CHEST 2V
1 series · 2 of 2 positions shown · non-contrast
Comparison: none

REASON FOR EXAM: Shortness of Breath
COMMENTS:   May transport without cardiac monitor

PROCEDURE:     DXR - DXR CHEST PA (OR AP) AND LATERAL  - March 20, 2011  [DATE]
RESULT:     Comparison: 09/20/2010

[Series 1: w chest pa · 0.14mm/px · 2 of 2 slices shown]
[im 1/2]
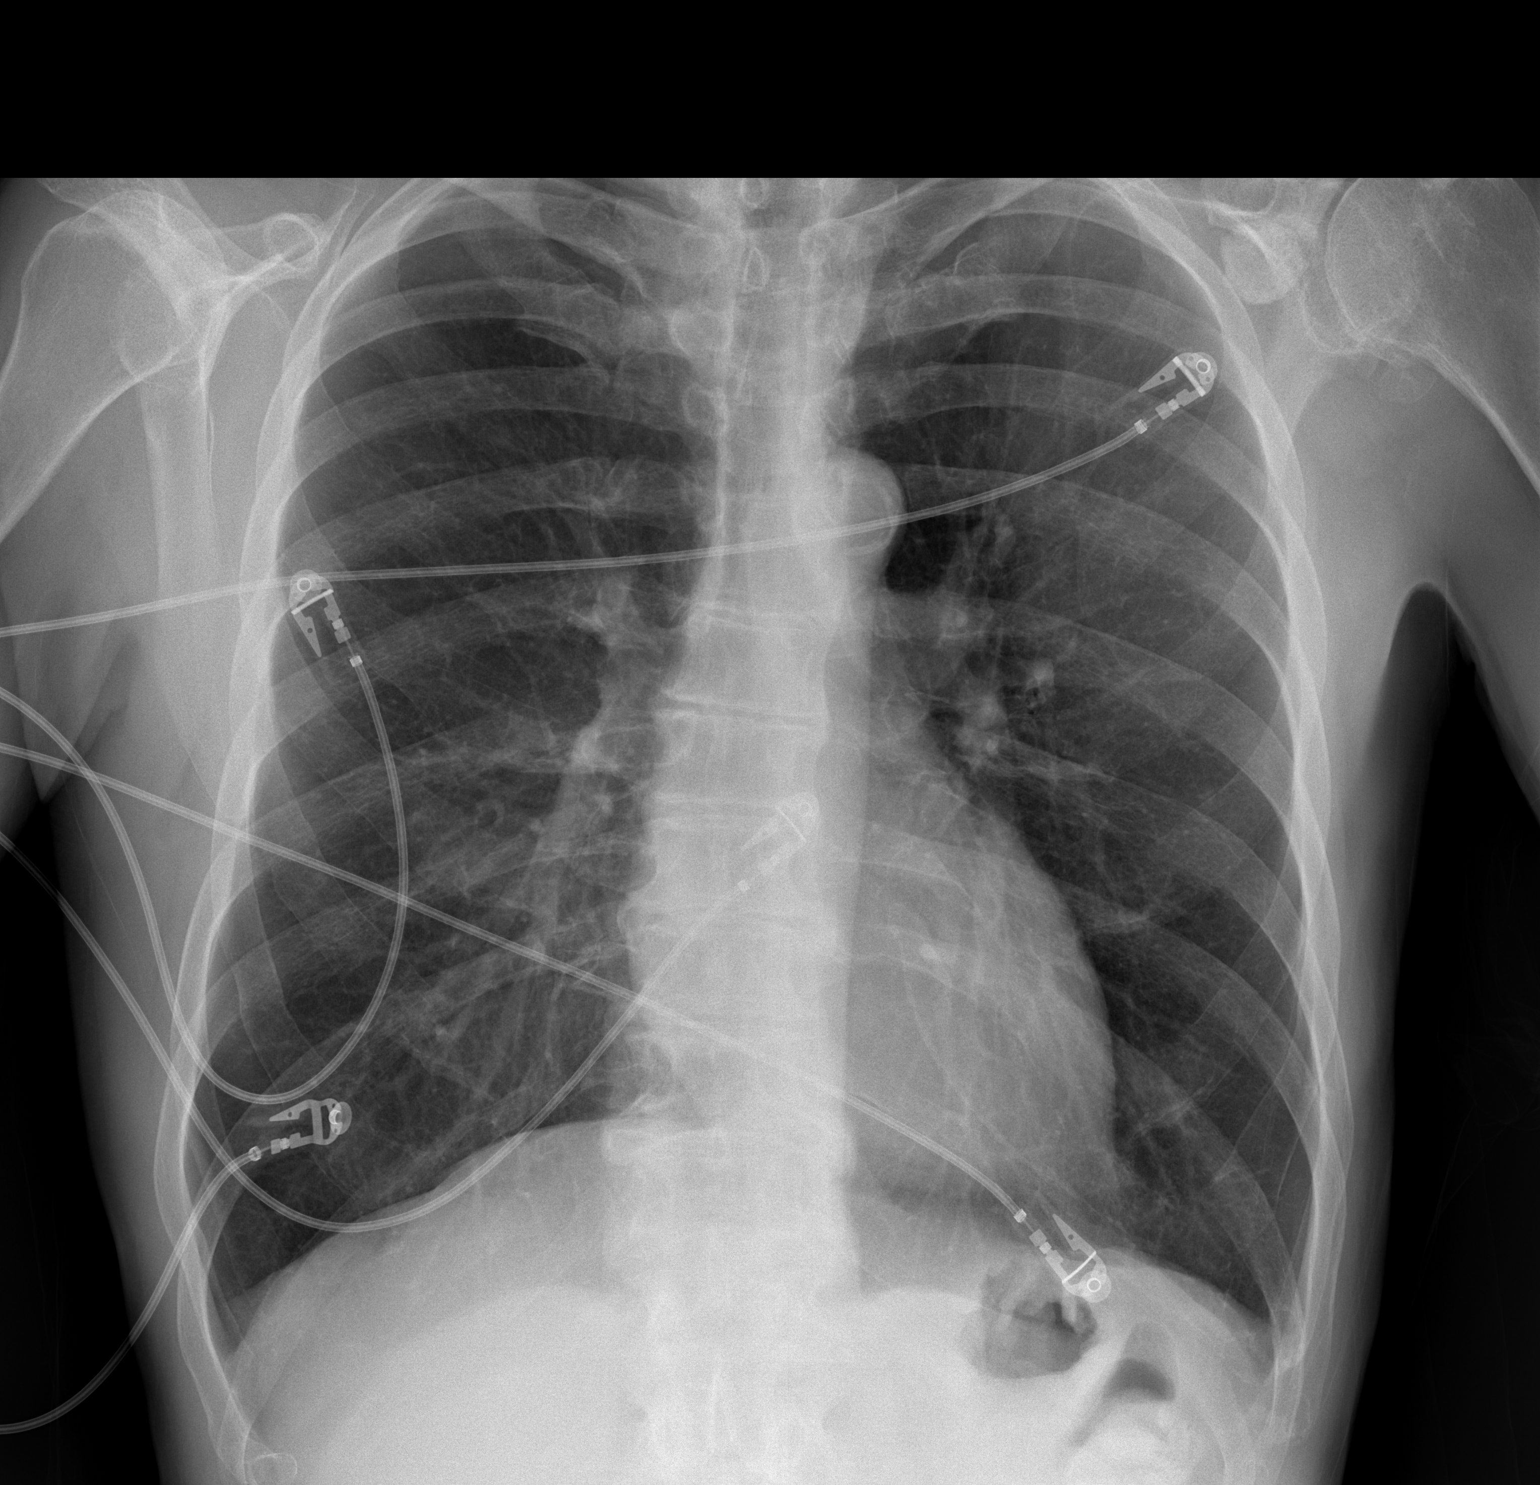
[im 2/2]
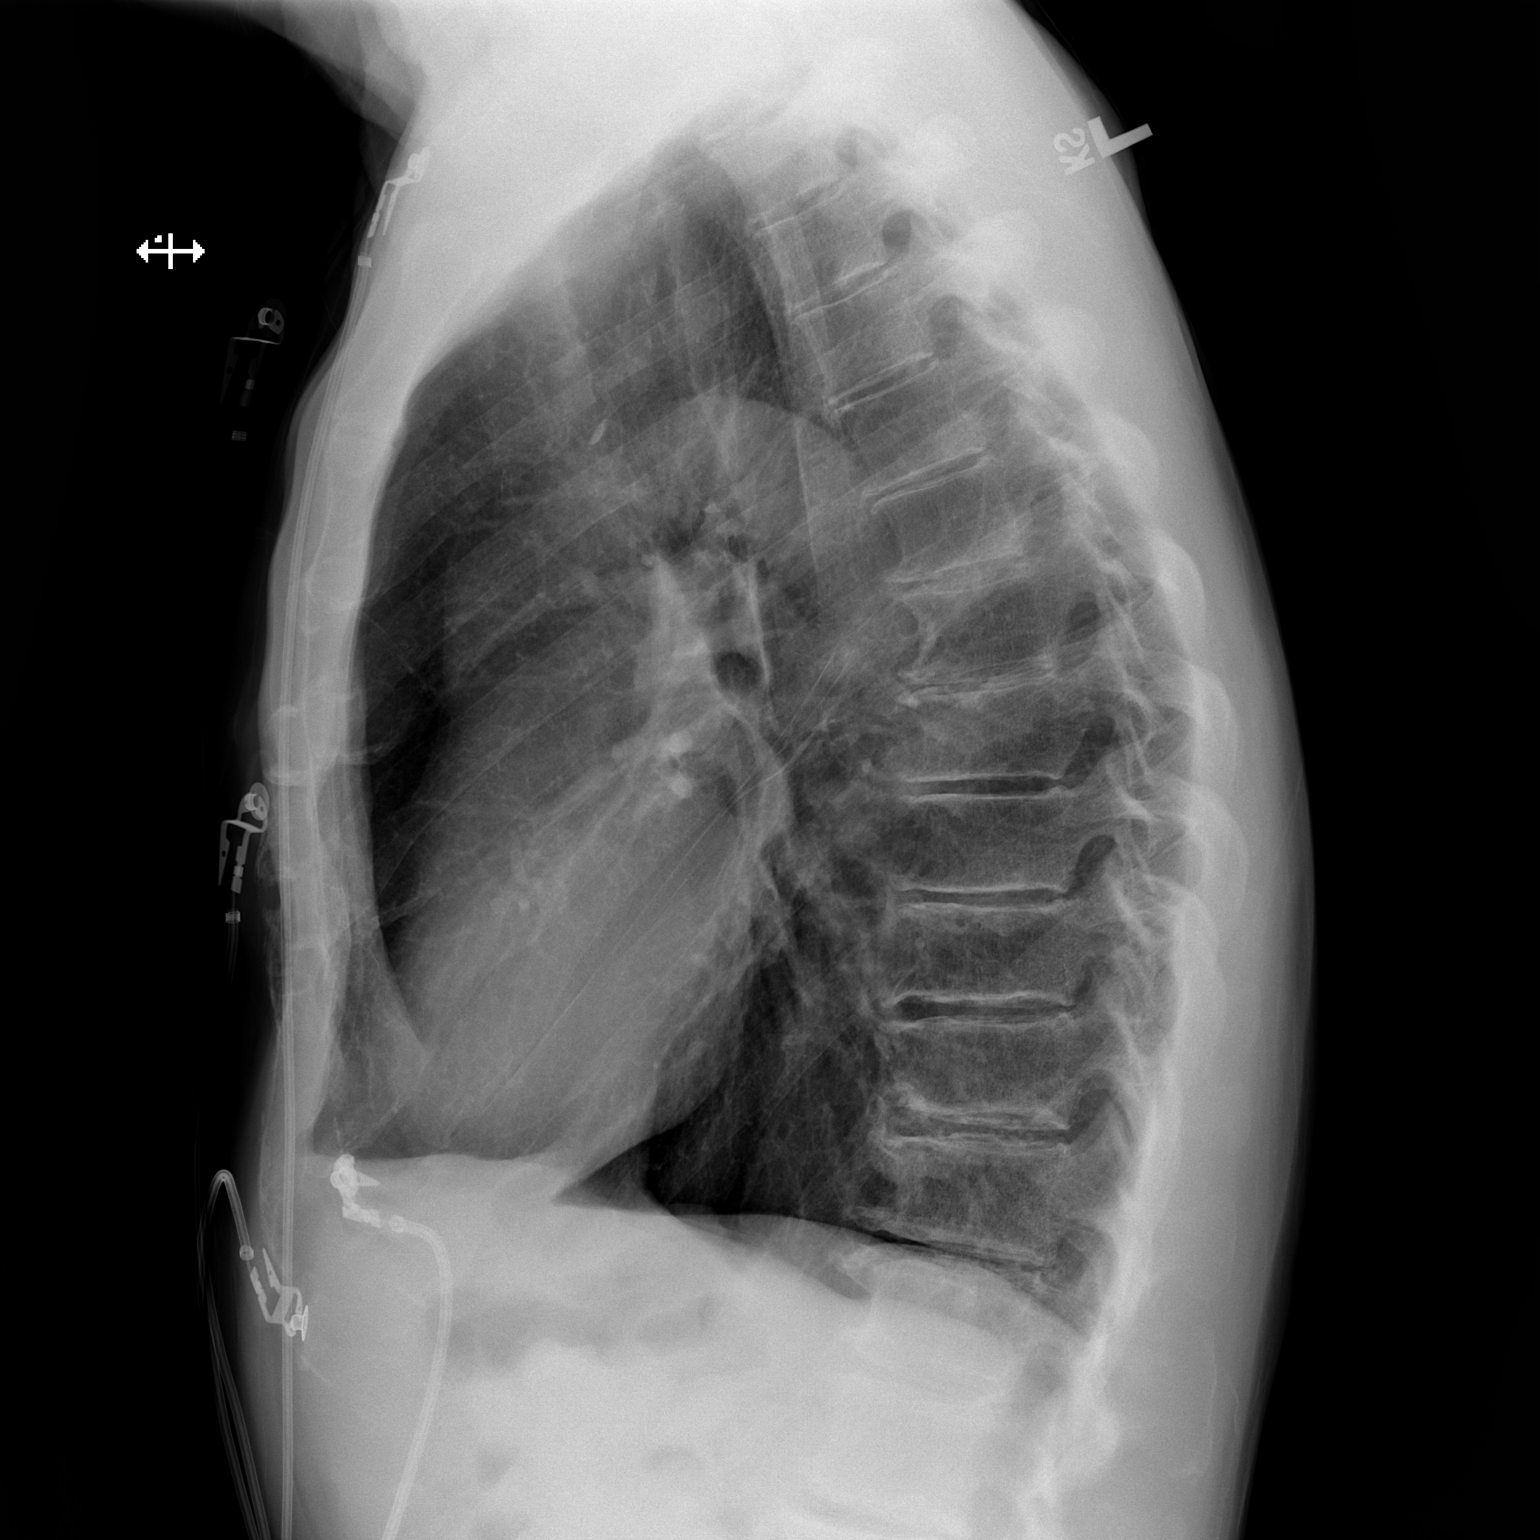

[2 of 2 positions shown; findings below may reference images not displayed]

FINDINGS: The heart and mediastinum are stable. The lungs are hyperinflated. No focal
pulmonary opacities.
IMPRESSION: Hyperinflation. Otherwise, no acute cardiopulmonary disease.

## 2013-07-15 IMAGING — US ABDOMEN ULTRASOUND
1 series · 17 of 25 positions shown · non-contrast
Comparison: none

REASON FOR EXAM: elevated transminases
COMMENTS:

PROCEDURE:     US  - US ABDOMEN GENERAL SURVEY  - April 08, 2011  [DATE]
RESULT:

[Series 1: abdomen ultrasound · 17 of 84 slices shown]
[im 1/84]
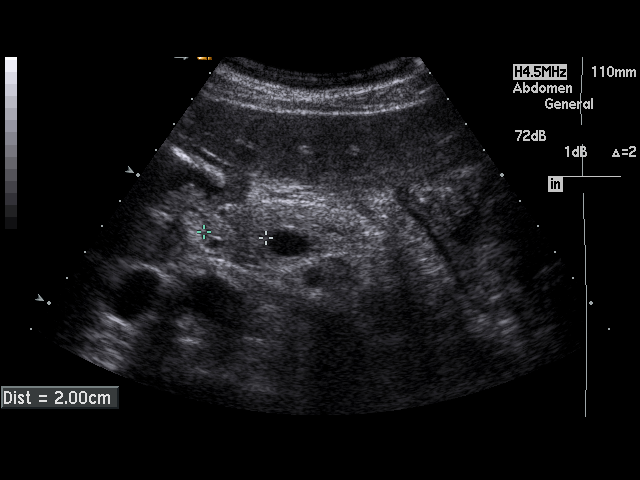
[im 7/84]
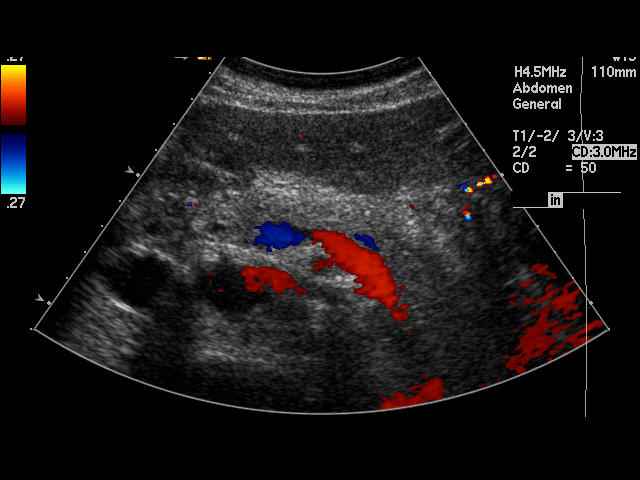
[im 11/84]
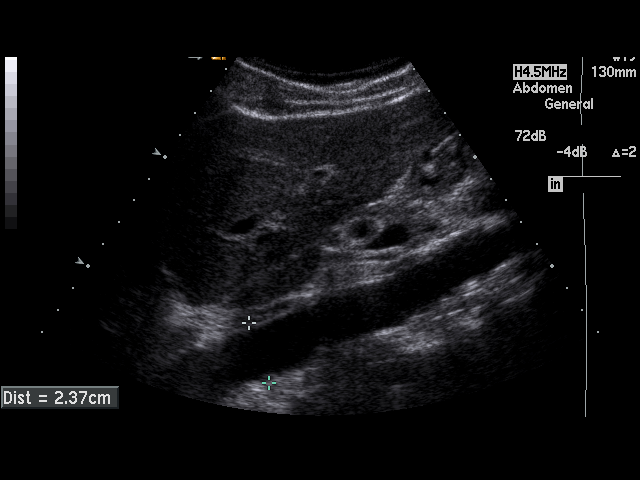
[im 18/84]
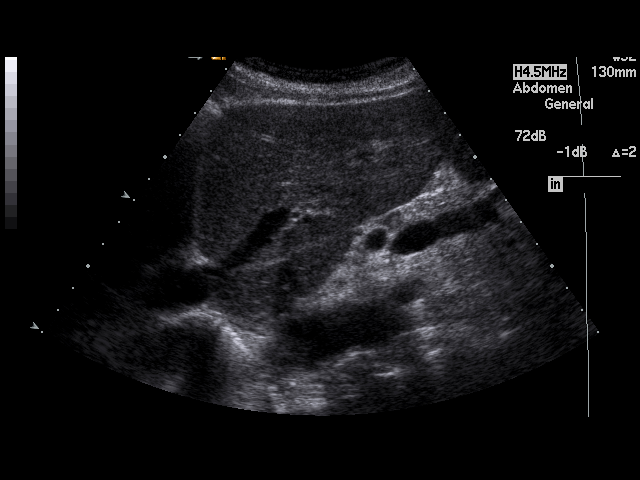
[im 21/84]
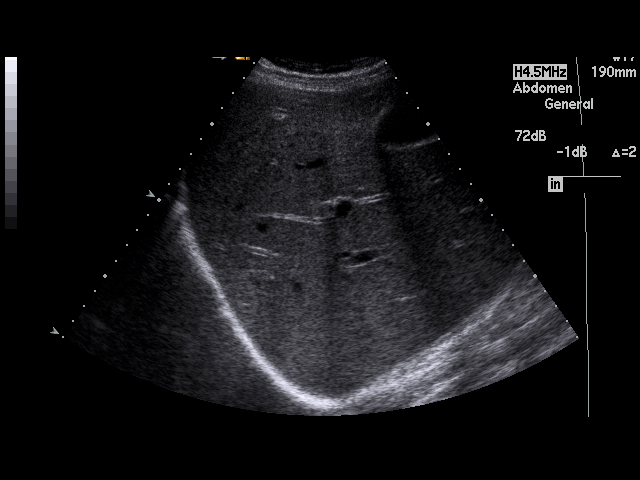
[im 28/84]
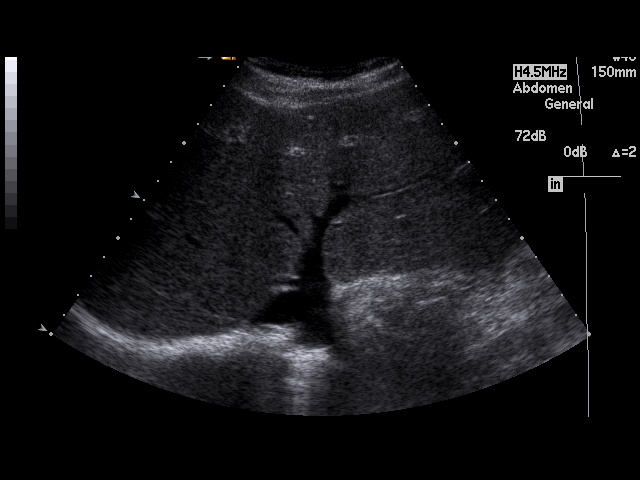
[im 32/84]
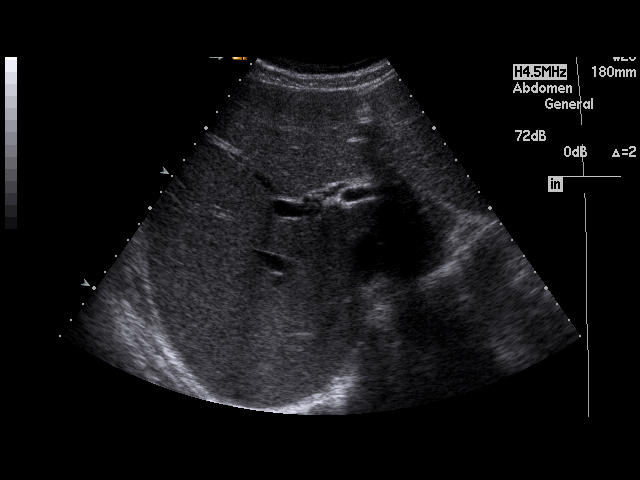
[im 39/84]
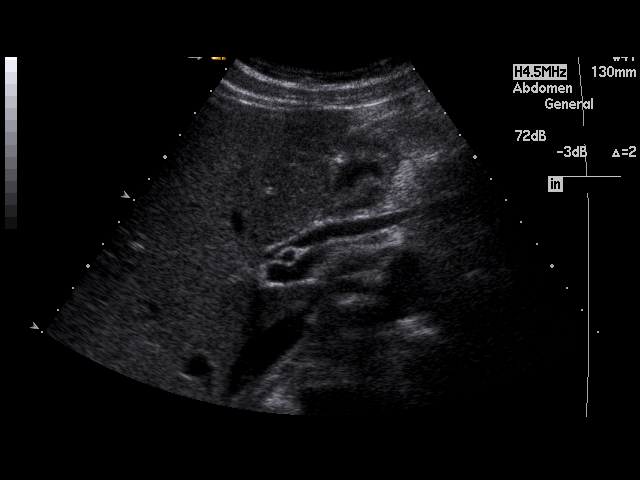
[im 42/84]
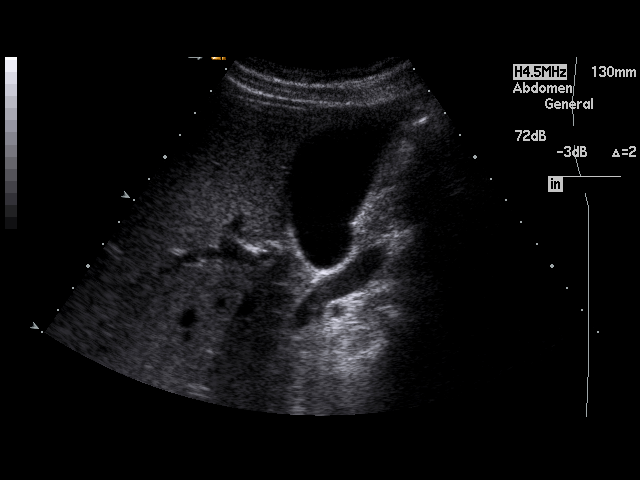
[im 45/84]
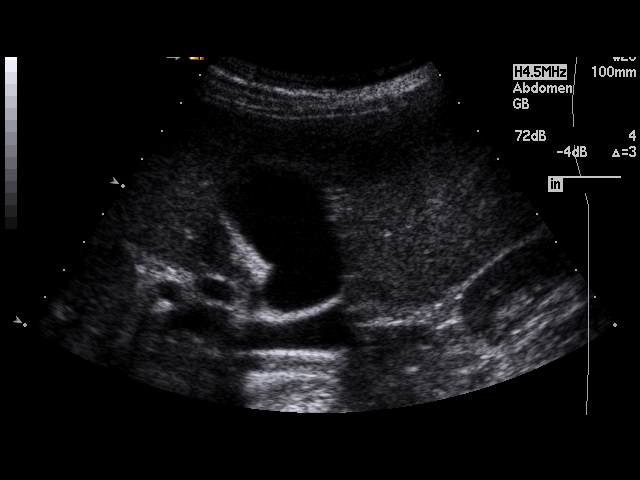
[im 52/84]
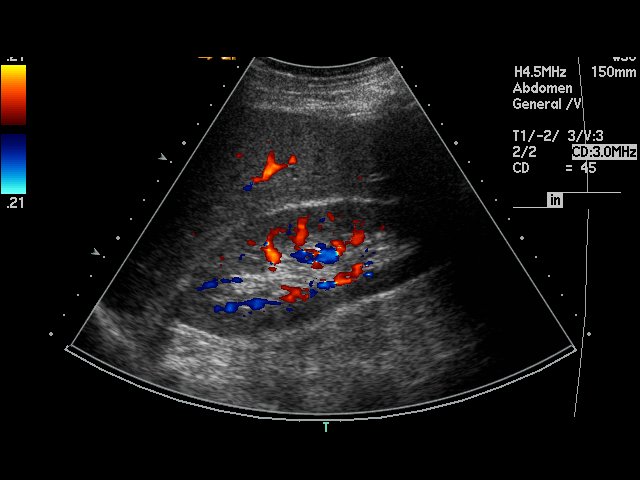
[im 56/84]
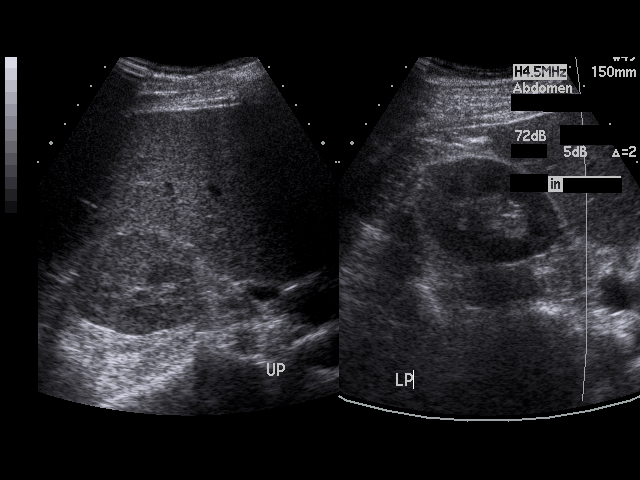
[im 63/84]
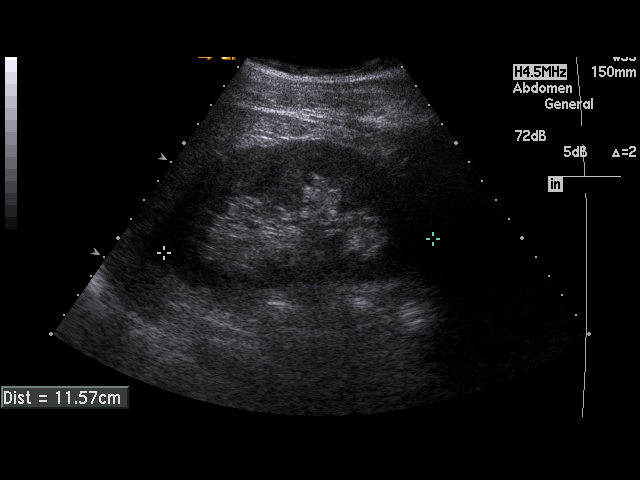
[im 66/84]
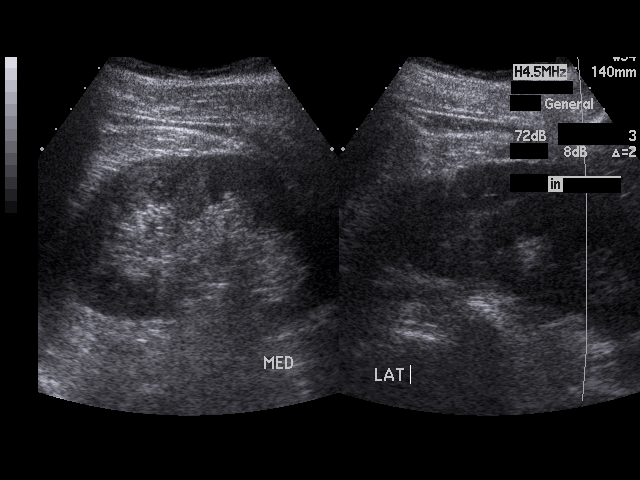
[im 73/84]
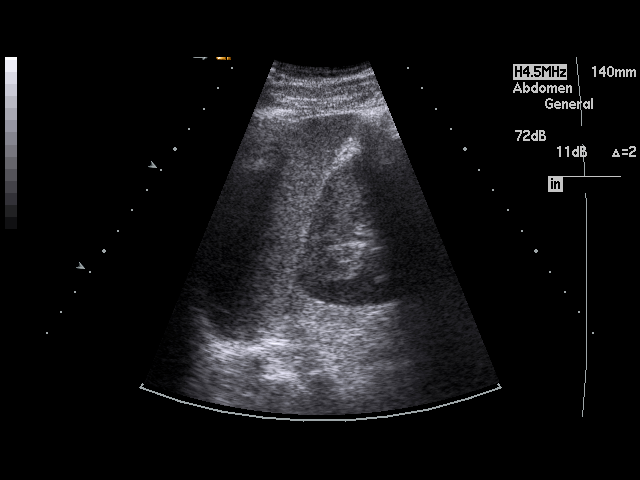
[im 77/84]
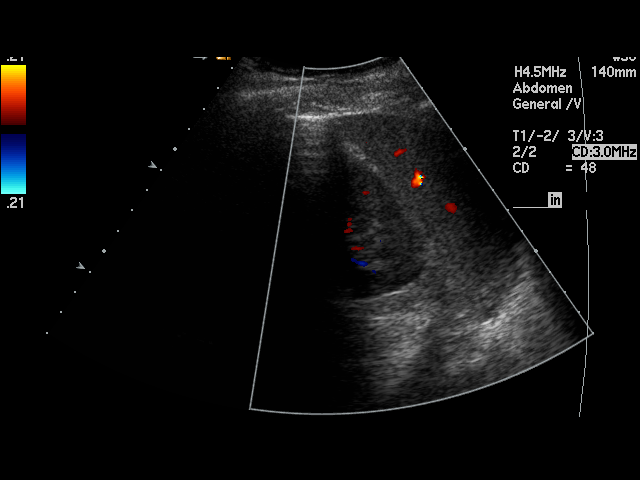
[im 84/84]
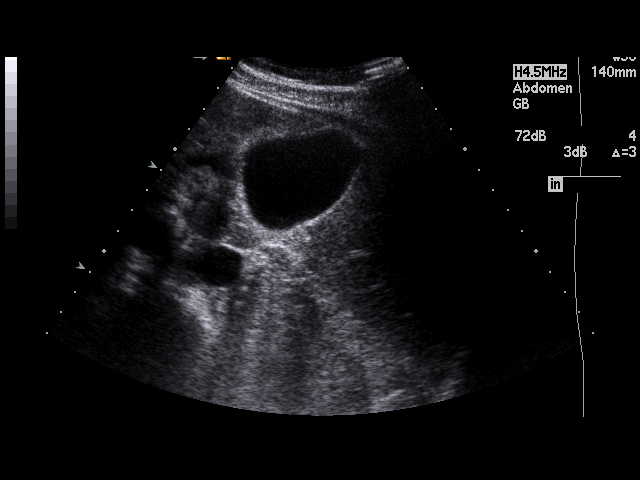

[17 of 25 positions shown; findings below may reference images not displayed]

FINDINGS: The liver demonstrates a homogeneous echotexture. Hepatopetal flow
is appreciated within the portal vein. The pancreas is partially visualized
and is grossly unremarkable. The aorta and IVC are unremarkable. The spleen
demonstrates a homogeneous echotexture measuring 9.93 cm in longitudinal
dimensions. Evaluation of the gallbladder demonstrates no evidence of
pericholecystic fluid, gallstones or sludging. The gallbladder wall
thickness is 1.9 mm. The common bile duct measures 4.4 mm in diameter.

The right kidney measures 11.64 x 6.24 x 5.46 cm and the left 11.57 x 5.83 x
6.01 cm. There is appropriate corticomedullary differentiation without
evidence of hydronephrosis, masses or calculi.
IMPRESSION: Unremarkable abdominal ultrasound.

## 2013-07-15 IMAGING — CT CT HEAD WITHOUT CONTRAST
2 series · 16 of 30 positions shown, 20 images · non-contrast
Comparison: none

REASON FOR EXAM: AMS, fever
COMMENTS:   May transport without cardiac monitor

PROCEDURE:     CT  - CT HEAD WITHOUT CONTRAST  - April 08, 2011 [DATE]
RESULT:     Comparison:  01/26/2010
TECHNIQUE: Multiple axial images from the foramen magnum to the vertex were
obtained without IV contrast.

[Series 2: without · axial · non-contrast · 0.38mm/px · z∈[-154,-24]mm · 13 of 32 slices shown, 17 images]
[im 3/32  brain]
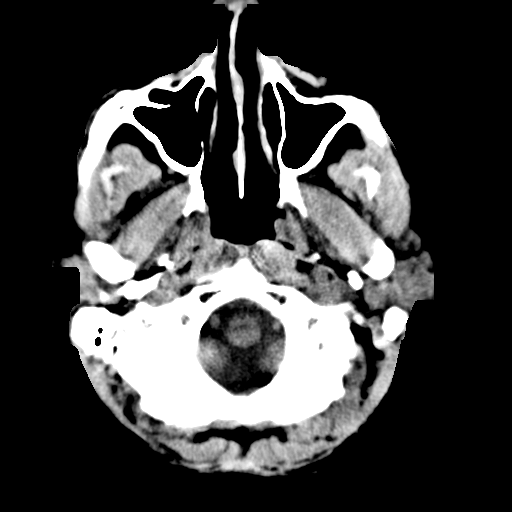
[im 3/32  bone]
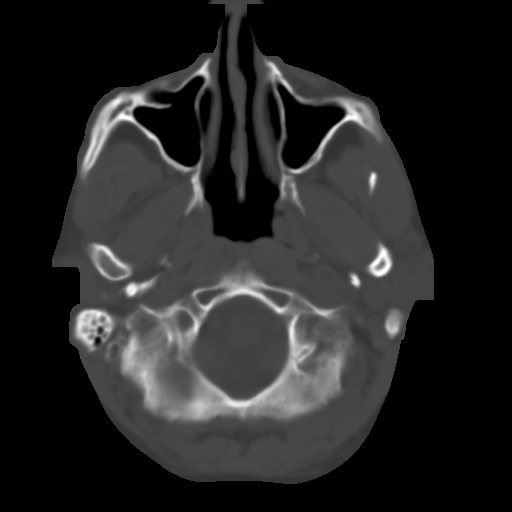
[im 5/32  brain]
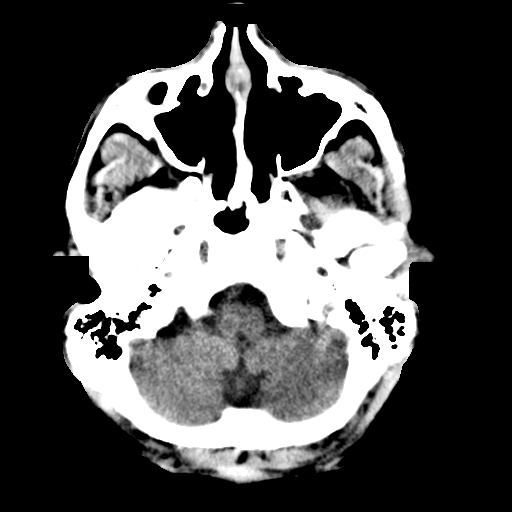
[im 7/32  brain]
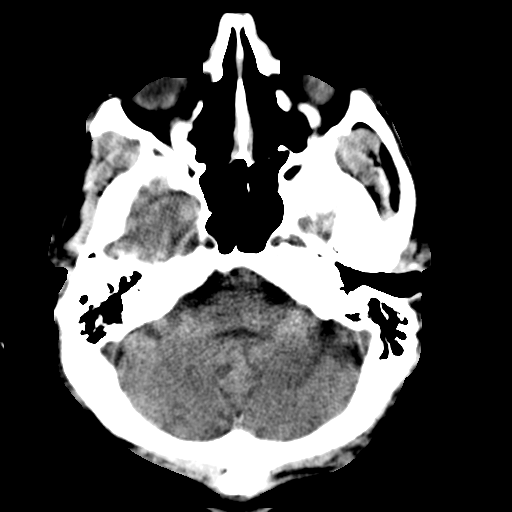
[im 9/32  brain]
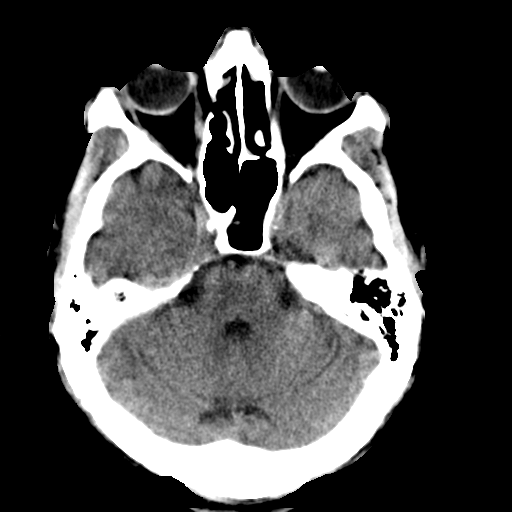
[im 12/32  brain]
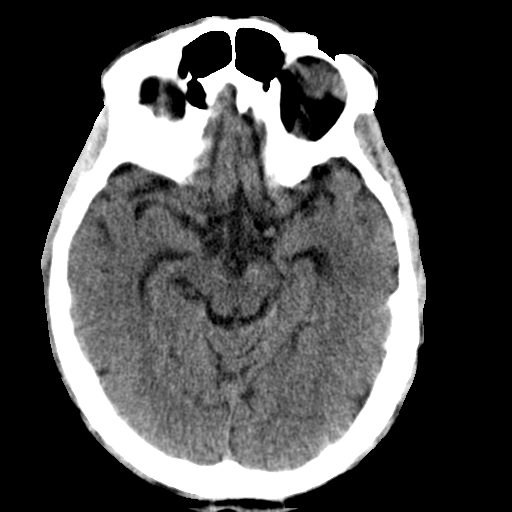
[im 12/32  bone]
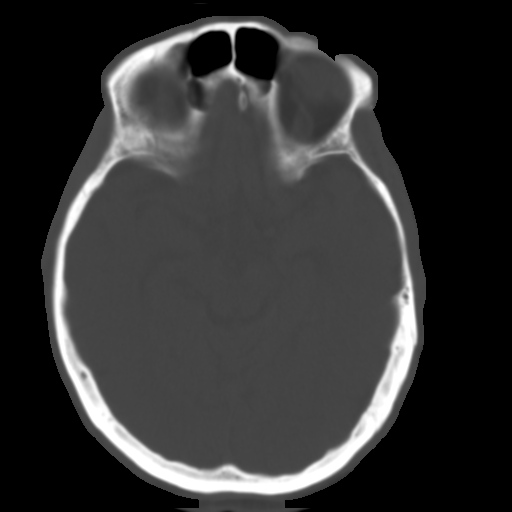
[im 14/32  brain]
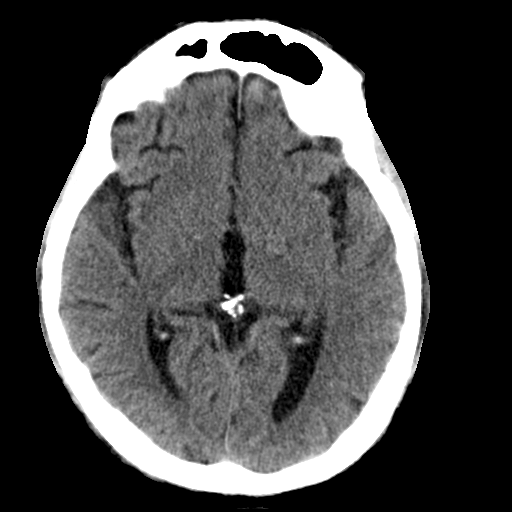
[im 16/32  brain]
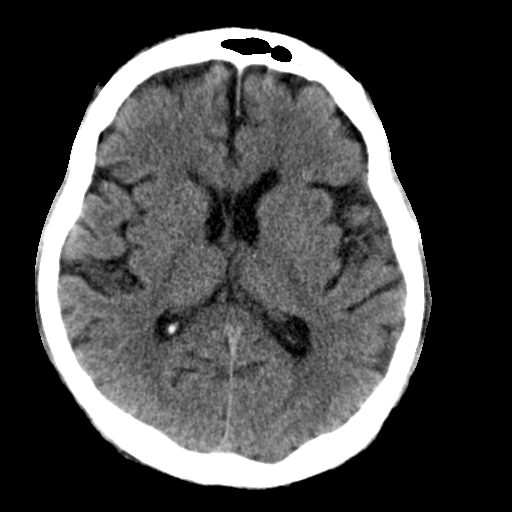
[im 18/32  brain]
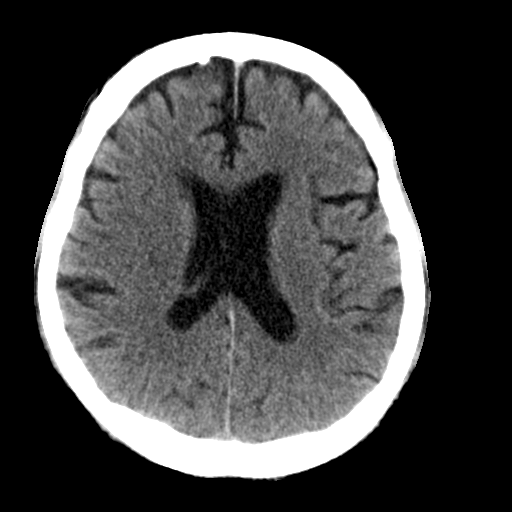
[im 20/32  brain]
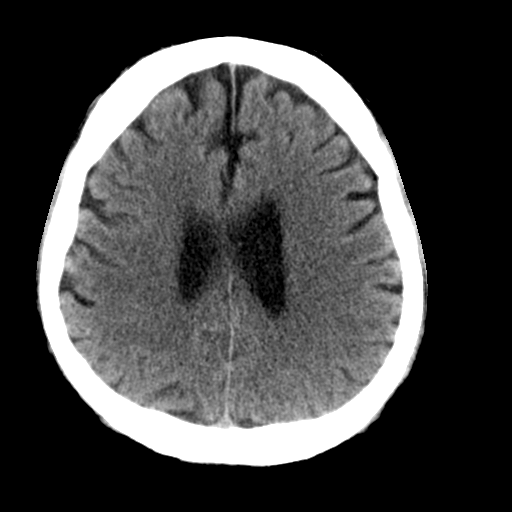
[im 20/32  bone]
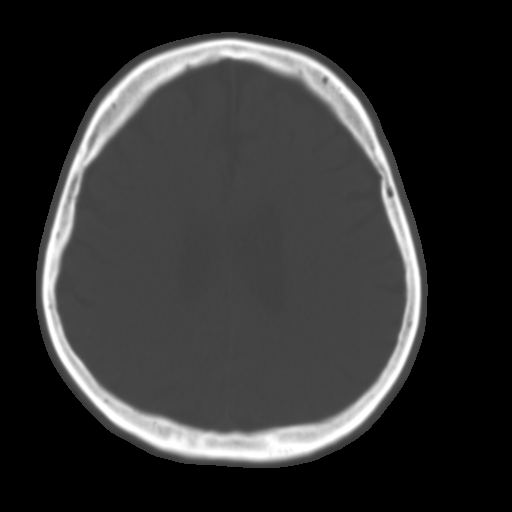
[im 23/32  brain]
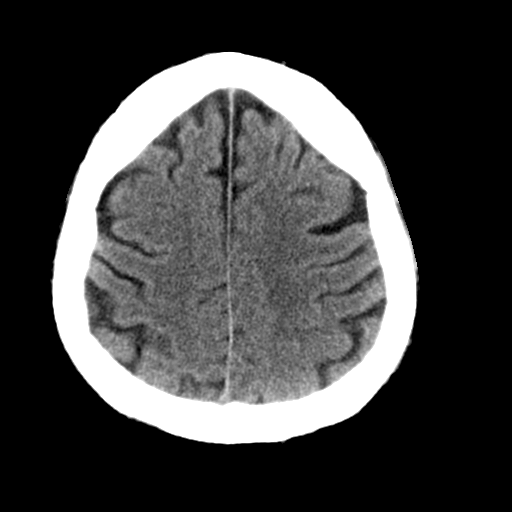
[im 25/32  brain]
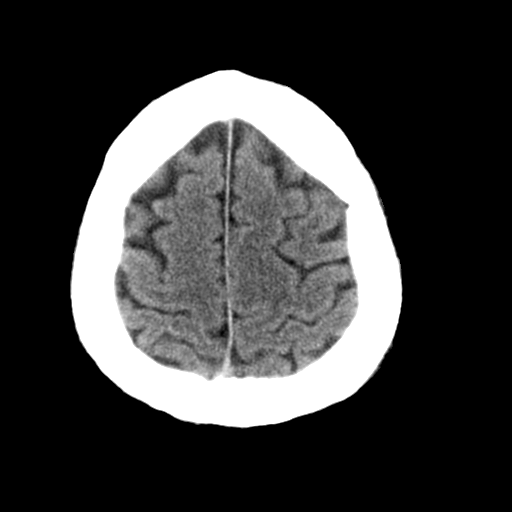
[im 27/32  brain]
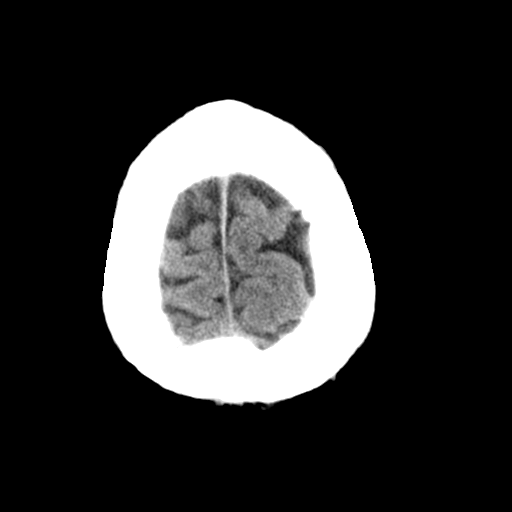
[im 29/32  brain]
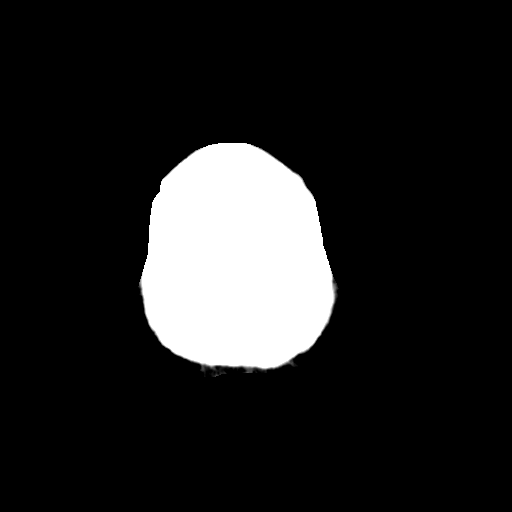
[im 29/32  bone]
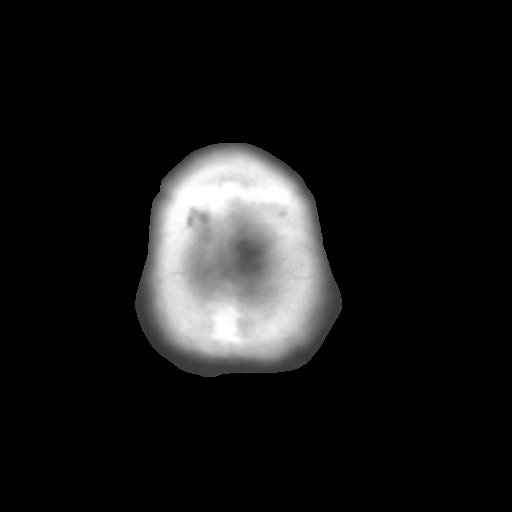

[Series 3: bone · axial · 0.38mm/px · z∈[-154,-110]mm · 3 of 32 slices shown]
[im 3/32  bone]
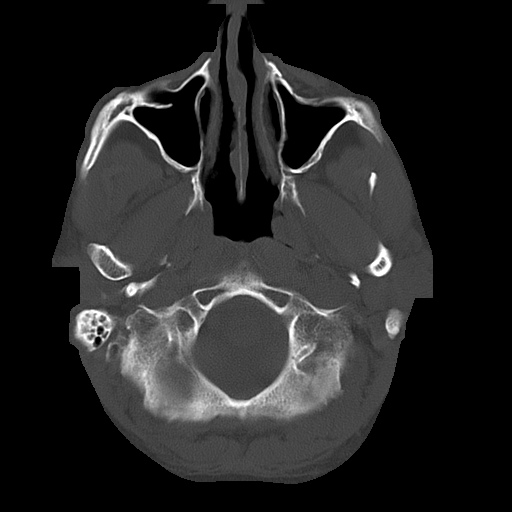
[im 7/32  bone]
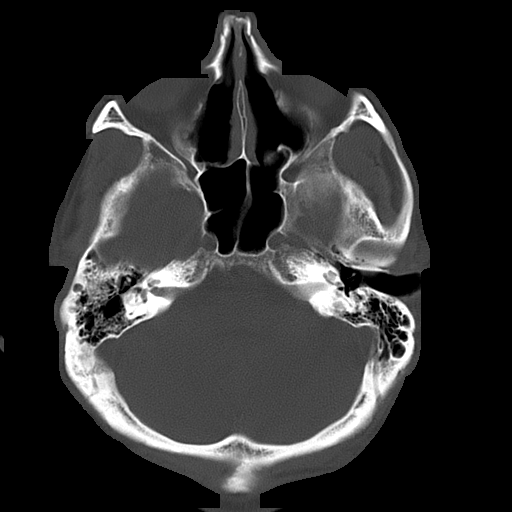
[im 12/32  bone]
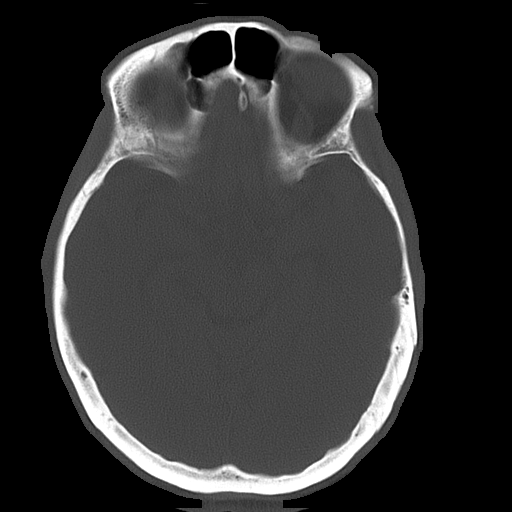

[16 of 30 positions shown; findings below may reference images not displayed]

FINDINGS: There is no evidence for mass effect, midline shift, or extra-axial fluid
collections. There is no evidence for space-occupying lesion, intracranial
hemorrhage, or cortical-based area of infarction.

Postsurgical changes seen from resection of the medial walls of the
bilateral maxillary sinuses.

The osseous structures are unremarkable.
IMPRESSION: No acute intracranial process.

## 2014-02-14 ENCOUNTER — Encounter (HOSPITAL_COMMUNITY): Payer: Self-pay | Admitting: Cardiology

## 2014-06-30 NOTE — Consult Note (Signed)
Pt has not passed any blood today.  He has tenderness in suprapubic and LLQ area in abd.  CT suggests ischemic or infectious findings.  He has not had a colonoscopy in many years.  Will give 2 quarts of Miralax and Gatorade starting at 5 am and do colonoscopy with anesthesia support tomorrow.  Electronic Signatures: Scot JunElliott, Bridget  T (MD)  (Signed on 18-Feb-13 18:36)  Authored  Last Updated: 18-Feb-13 18:36 by Scot JunElliott, Jamieson Lisa T (MD)

## 2014-06-30 NOTE — Discharge Summary (Signed)
PATIENT NAME:  Wayne Guzman, Markon L MR#:  [id number] DATE OF BIRTH:  20-Jun-1950  DATE OF ADMISSION:  09/02/2011 DATE OF DISCHARGE:  09/06/2011  DIAGNOSES:  1. Left periorbital cellulitis.  2. Methicillin-resistant Staphylococcus aureus abscess. 3. Leukocytosis.  4. Clostridium difficile colitis on oral treatment.  5. History of atrial fibrillation.  6. Chronic obstructive pulmonary disease. 7. Hypertension.  8. History of gastrointestinal bleeding.  9. Anxiety. 10. Bipolar disorder.    DISPOSITION: Patient is being discharged home.   FOLLOW UP: Follow primary care physician and Dr. Kemper Durielarke in 1 to 2 weeks after discharge.   DIET: Low sodium.   ACTIVITY: As tolerated.   DISCHARGE MEDICATIONS:  1. Lactobacillus 1 capsule p.o. t.i.d.  2. Amiodarone 200 mg daily.  3. Zyvox 600 mg b.i.d. for seven days. 4. Vancomycin 250 mg every six hours p.r.n. for total of 17 days.  5. Olanzapine 25 mg at bedtime.  6. DuoNebs every four hours p.r.n.  7. Protonix 40 mg daily.  8. Depakote 750 mg b.i.d.  9. Combivent 2 to 4 puffs q.6 hours p.r.n.  10. Neurontin 600 mg q.i.d. as needed.  11. Spiriva 18 mcg inhaled daily.  12. Buspirone 50 mg b.i.d.  13. Aspirin 81 mg 2 tablets once a day. 14. Daliresp 500 mcg once a day.  15. Clonazepam 2 mg b.i.d.  16. Advair 250/50 as directed.  17. Bentyl 10 mg q.8 hours as needed for diarrhea.  18. Bactroban 2% apply topically to affected area t.i.d. 19. Acetaminophen/hydrocodone 325/5 mg q.6 hours as needed for pain.   CONSULTATIONS:  1. ENT consultation with Dr. Gertie BaronMadison Clark. 2. ID consultation with Dr. Leavy CellaBlocker.   LABORATORY, DIAGNOSTIC AND RADIOLOGICAL DATA: CT maxillofacial region showed left periorbital cellulitis, small abscess in the subcutaneous tissue just along the inferior aspect of the left nasal lesion. No intraorbital abscess or retrobulbar inflammatory changes. Microbiology: Blood cultures no growth so far. Stool positive for C. difficile.  Wound culture MRSA. White count 20.2 on admission, 11.7 by the time of discharge. Hemoglobin ranging from 10.2 to 8.4. Normal platelet count. CBC and CMP essentially normal. Urine drug screen positive for opiates. Patient is on p.r.n. narcotics.   HOSPITAL COURSE: Patient is a 64 year old male with past medical history of hypoxic respiratory failure due to chronic obstructive pulmonary disease complicated by pneumonia, intubation, atrial fibrillation, aortic stenosis, hypertension, chronic anemia, recently treated for lice and scabies on oral vancomycin for C. difficile therapy admitted, presented with left-sided facial swelling. He was found to have left periorbital cellulitis and an abscess in his left nasal fold. CT of the maxillofacial region showed similar findings. He was admitted to the hospital and started on IV vancomycin and Unasyn. Blood cultures were obtained which showed no growth. ENT: Consultation with Dr. Jacinto ReapMadison Clarke was obtained. Patient underwent an incision and drainage on 09/03/2011. Wound cultures were sent and grew MRSA. Since patient was also on oral vancomycin therapy for Clostridium difficile, ID consultation with Dr. Leavy CellaBlocker was obtained to guide antibiotic therapy. He recommended discharging the patient home on seven days of p.o. vancomycin and Zyvox since patient had already gotten three days of IV vancomycin in the hospital. He recommended the patient continue oral vancomycin for a total of 17 more days so that he can have 10 more additional days of oral vancomycin after he completes oral Zyvox. Patient had leukocytosis on admission which improved. He has a history of atrial fibrillation and is on Coumadin and amiodarone. The rest of his medical problems  including chronic obstructive pulmonary disease and hypertension remained stable. He is being discharged home in a stable condition.   TIME SPENT: 45 minutes.   ____________________________ Darrick Meigs,  MD sp:cms D: 09/06/2011 17:03:28 ET T: 09/07/2011 12:22:32 ET JOB#: [id number]  cc: Darrick Meigs, MD, <Dictator> Darrick Meigs MD ELECTRONICALLY SIGNED 09/08/2011 7:09

## 2014-06-30 NOTE — H&P (Signed)
PATIENT NAME:  Wayne HaffCHURCH, Wayne Guzman MR#:  [id number] DATE OF BIRTH:  1950/11/24  DATE OF ADMISSION:  04/25/2011  PRIMARY CARE PHYSICIAN: [facility]   CHIEF COMPLAINT: Very weak and abdominal pain.   HISTORY OF PRESENT ILLNESS: This is a 64 year old man who presents to the hospital with feeling very weak since Friday, felt like he was going to pass out. He has been having abdominal pain, graded 7.5/10 in intensity in this central abdomen, feels like somebody is punching him. Drinking cold fluids makes it better but he is not able to eat anything. He had burgundy red bowel movement this morning today x1 and came into the Emergency Room for further evaluation. In the Emergency Room he was found to be anemic with a hemoglobin of 7.9, BUN elevated at 29 and INR was up at 2.0 with a PT elevated at 22.8. Hospitalist services were contacted for further evaluation   PAST MEDICAL HISTORY:  1. Recent hospitalization, discharge on 04/13/2011, was diagnosed with pneumonia, rhabdomyolysis, atrial fibrillation with rapid ventricular response.  2. History of aortic stenosis, undergoing evaluation replacement. 3. History of hypertension. 4. Chronic obstructive pulmonary disease, does have oxygen at home but currently pulse oximetry is good off oxygen. 5. Anxiety disorder. 6. Chronic back pain. 7. Bipolar disorder.  8. Anemia.   PAST SURGICAL HISTORY:  1. Two sinus surgeries. 2. Two knee surgeries.  3. Carpal tunnel surgery.   MEDICATIONS: As per last discharge summary include:  1. Nexium 40 mg daily.  2. Advair Diskus 250/50, 1 inhalation twice a day.  3. Depakote 1500 mg daily.  4. Daliresp 500 mg daily.  5. Aspirin 81 mg 2 tablets daily.  6. Clonazepam 2 mg twice a day.  7. BuSpar 15 mg twice a day.  8. Spiriva 1 inhalation daily.  9. Gabapentin 600 mg q.i.d.  10. Olanzapine 25 mg at bedtime.  11. Alprazolam 1 mg twice a day.  12. Valium 10 mg daily.  13. Norco 5/325, 1 tablet every six hours as  needed for pain.  14. Metoprolol 50 mg every 12 hours. 15. Combivent as needed for shortness of breath.  16. Amiodarone 200 mg twice a day.  17. Xarelto 20 mg p.o. daily.   ALLERGIES: No known drug allergies.   SOCIAL HISTORY: Quit smoking one week ago. No alcohol. No drug use. Is on disability. Lives alone.   FAMILY HISTORY: Father died of heart trouble. Mother died at 278 of chronic medical issues.   REVIEW OF SYSTEMS: CONSTITUTIONAL: Positive for chills. Positive for sweats. Positive for weight loss. Positive for weakness. EYES: He does wear glasses and does have some burning of the eyes. EARS, NOSE, MOUTH, AND THROAT: No hearing loss. No sore throat. No difficulty swallowing. CARDIOVASCULAR: No chest pain. No palpitations. RESPIRATORY: No shortness of breath. No coughing. No wheezing. No sputum. No hemoptysis. GASTROINTESTINAL: Positive for nausea, decreased appetite. No vomiting. Positive for abdominal pain. Positive for burgundy bowel movement this morning. GENITOURINARY: No burning on urination. No hematuria. MUSCULOSKELETAL: No joint pain or muscle pain but chronic back pain. INTEGUMENT: No rashes or eruptions. NEUROLOGIC: Felt like he was going to pass out. PSYCHIATRIC: On medication for anxiety and bipolar. ENDOCRINE: No thyroid problems. HEMATOLOGIC/LYMPHATIC: History of anemia.   PHYSICAL EXAMINATION:  VITAL SIGNS: Temperature 97.8, pulse 71, respirations 18, blood pressure 131/76, pulse oximetry 98%, sitting blood pressure 113/61, standing 99/67.  GENERAL: No respiratory distress.   EYES: Conjunctivae pale. Lids normal. Pupils equal, round, and reactive to light. Extraocular  muscles intact. No nystagmus.   EARS, NOSE, MOUTH, AND THROAT: Tympanic membranes no erythema. Nasal mucosa no erythema. Throat no erythema. No exudate seen. Lips and gums normal.   NECK: No JVD. No bruits. No lymphadenopathy. No thyromegaly. No thyroid nodules palpated.   RESPIRATORY: Lungs clear to  auscultation. No use of accessory muscles to breathe. No rhonchi, rales, or wheeze heard.   CARDIOVASCULAR: S1, S2 normal. Positive 3/6 systolic ejection murmur. Carotid upstroke 2+ bilaterally. No bruits. Dorsalis pedis pulses 2+ bilaterally. No edema of the lower extremity.   ABDOMEN: Soft. Positive tenderness in the epigastric area. No organomegaly/splenomegaly. Normoactive bowel sounds. No masses felt.   LYMPHATIC: No lymph nodes in the neck.   MUSCULOSKELETAL: No clubbing, edema, or cyanosis.   SKIN: No rashes or ulcers seen.   NEUROLOGIC: Cranial nerves II through XII grossly intact. Deep tendon reflexes 2+ bilateral lower extremities.   PSYCHIATRIC: Patient is oriented to person, place, and time.   LABORATORY, DIAGNOSTIC, AND RADIOLOGICAL DATA: White blood cell count 12.5, hemoglobin and hematocrit 7.9 and 24.1, platelet count 582, glucose 109, BUN 29, creatinine 0.9, sodium 137, potassium 4.3, chloride 99, CO2 26, calcium 9.2, alkaline phosphatase 35, ALT 19, AST 21, valproic acid 41, lipase 54, PT 36.5, INR 2.0, PT 22.8. EKG normal sinus rhythm, no acute ST-T wave changes.     ASSESSMENT AND PLAN:  1. Abdominal pain, most likely gastrointestinal-related. ER physician ordered a CT scan of the abdomen. I doubt this is acute abdomen, most likely ulcer related. Patient is on Protonix drip.  2. Acute on chronic anemia with GI bleed. Patient is orthostatic. Will give IV fluid hydration, transfuse a unit of blood and continue to monitor closely. Since the patient is on Xarelto and aspirin need to monitor closely for further bleeding. Need to get serial hemoglobins. Case discussed with Dr. Niel Hummer. Patient will end up needing an endoscopy and colonoscopy probably during this hospitalization. Need to wait for Xarelto to get out of the system prior to procedures.  3. Coagulopathy. Unclear cause. Xarelto shouldn't cause his INR to go up. Will give vitamin K and recheck an INR in the morning.   4. Chronic obstructive pulmonary disease. Respiratory status stable even with recent pneumonia. Continue inhalers.  5. History of atrial fibrillation, currently in normal sinus rhythm. Continue amiodarone and metoprolol if blood pressure will tolerate. Need to stop blood thinners at this point.  6. Aortic stenosis. Evaluation for valve replacement by cardiothoracic surgery. Anemia work-up needed to be done prior.  7. Chronic pain. Patient is on Norco.  8. Anxiety and bipolar disorder. The patient is on numerous psychiatric medications.  9. Benefits and risks of blood transfusion explained to the patient. Patient understands the risks and is willing to proceed. All questions answered. With Xarelto in his system may take a while for bleeding to stop but since the patient only had one bowel movement today will continue to monitor closely.   TIME SPENT ON ADMISSION: 55 minutes.   ____________________________ Herschell Dimes. Renae Gloss, MD rjw:cms D: 04/25/2011 20:06:55 ET T: 04/26/2011 05:48:01 ET JOB#: [id number]  cc: Herschell Dimes. Renae Gloss, MD, <Dictator> [facility] Salley Scarlet MD ELECTRONICALLY SIGNED 04/26/2011 15:31

## 2014-06-30 NOTE — Consult Note (Signed)
PATIENT NAME:  Wayne Guzman, Wayne Guzman MR#:  [id number] DATE OF BIRTH:  [birth year]  DATE OF CONSULTATION:  04/29/2011  REFERRING PHYSICIAN:  Dr. Sherryll Burger  CONSULTING PHYSICIAN:  Rosalyn Gess. Eller Sweis, MD  REASON FOR CONSULTATION: Fever and leukocytosis.   HISTORY OF PRESENT ILLNESS: The patient is a 64 year old white man with a past history significant for chronic active Hepatitis C infection, COPD, bipolar disorder, aortic stenosis, and recent hospitalization for pneumonia who was admitted on 02/17 with weakness and abdominal pain. The patient states that he was feeling that he had decreased energy for several days. He had been having some dark stool for a day or two prior to admission and presented to the Emergency Room. He states that he had been having cough and seizures but his description of the timeframe appeared to be prior to his prior admission for pneumonia. When asked specifically if he has had any seizures since he was discharged, he had said no. When asked if he was having significant cough or sputum production, he also said no but he did have some shortness of breath. He was found to have a hemoglobin of 7.9 on admission and INR of 2. He was admitted to the hospital. A CT scan of the abdomen and pelvis showed possible early colitis. He was started on metronidazole on February 18th. Zosyn was also started on that day but stopped after the next day. He underwent transfusion and on February 20th had some low-grade temperature up to 100.4 while receiving blood transfusion. A transfusion reaction investigation was performed and the conclusion was that this was suggestive of afebrile nonhemolytic transfusion reaction. He underwent EGD which showed no signs of active bleeding. He underwent colonoscopy which showed no signs of ulceration although there was melena noted in the colon. A Meckel's scan showed no signs of active bleeding. His temperature maximum during the transfusion was 101.4. Since his single episode  of fever, he has had no temperature elevations over 101 with the exception of early this morning when he had a 101.1 temperature. He was transferred to the CCU today because of atrial fibrillation with rapid ventricular response and hypotension. He had a chest x-ray today which showed a right lower lobe infiltrate. The patient currently is having some increased cough compared to his usual. He has not had any worsening of his shortness of breath. Overall he feels better with more energy since he has received the transfusion. Since being transferred to the CCU, his antibiotics have changed to Levaquin, metronidazole, Zosyn, and Linezolid. His white count on admission was 12.5 and has been up and down each day. Today it was 18.3 with an ANC of 16.0.   ALLERGIES: None.   PAST MEDICAL HISTORY:  1. Chronic active Hepatitis C infection.  2. Aortic stenosis.  3. Bipolar disorder.  4. Panic disorder.  5. Hypercholesterolemia.  6. Chronic obstructive pulmonary disease   SOCIAL HISTORY: The patient lives by himself. He is a prior smoker having quit approximately a week ago. He is a prior alcoholic having quit approximately 12 years ago. He has no pets at home. He has a history of injecting drug use in the past but none current.   FAMILY HISTORY: Positive for coronary disease.   REVIEW OF SYSTEMS: GENERAL: Prior to admission he had no fevers, chills, or sweats although per the history and physical he indicated that he had been having chills and sweats. He had been having general fatigue and weakness. HEENT: No headaches. No sinus congestion. No sore  throat. NECK: No stiffness. No swollen glands. RESPIRATORY: Some shortness of breath. No significant cough or sputum production, although since being admitted he has been having increased cough. CARDIAC: No chest pains or palpitations. No peripheral edema. GI: No nausea. No vomiting. Positive abdominal pain. No constipation or diarrhea. He has been having black tarry  looking stools. GU: No complaints. MUSCULOSKELETAL: Generalized weakness but no focal muscle complaints. NEUROLOGIC: No focal complaints. PSYCHIATRIC: No complaints. All other systems are negative.   PHYSICAL EXAMINATION:   VITAL SIGNS: T-max 101.4, T-current 99.0, pulse 100, blood pressure 85/56, 96% on 2 liters.   GENERAL: 64 year old white man in no acute distress.   HEENT: Normocephalic, atraumatic. Pupils equal and reactive to light. Extraocular motion intact. Sclerae, conjunctivae, and lids are without evidence for emboli or petechiae. Oropharynx shows no erythema or exudate. Teeth and gums are in fair condition.   NECK: Supple. Full range of motion. Midline trachea. No lymphadenopathy. No thyromegaly.   CHEST: He had some crackles bilaterally but no focal consolidation. He was moving air well and speaking in full sentences.   CARDIAC: Regular rate and rhythm without murmur, rub, or gallop.   ABDOMEN: Soft, mildly tender throughout but no rebound or guarding. No hepatosplenomegaly. No hernias noted.   EXTREMITIES: No evidence for tenosynovitis.   SKIN: No rashes. No stigmata of endocarditis, specifically no Janeway lesions or Osler nodes.   NEUROLOGIC: The patient is awake and interactive, moving all four extremities.   PSYCHIATRIC: Mood and affect appeared normal.   LABORATORY, DIAGNOSTIC, AND RADIOLOGICAL DATA: BUN 8, creatinine 0.95, bicarbonate 26, anion gap 9, AST 21, ALT 11, alkaline phosphatase 30, total bilirubin 0.4, white count 18.3 with hemoglobin 8.7, platelet count 296, ANC 16.0. Stool cultures from 02/19 show no growth. Blood cultures from 02/20 show no growth. A urinalysis from 02/20 was unimpressive. A CT scan of the abdomen and pelvis with contrast demonstrated earlier mild colitis in the descending and sigmoid colon but no other findings were noted, specifically the cuts of the chest did not show any infiltrate. A Meckel's scan from 02/19 showed no findings  suspicious for Meckel's diverticulum. A chest x-ray from February 20th showed a diffuse left lobe lower lobe infiltrate compatible with pneumonia as well as evidence for probable COPD.   IMPRESSION: This is a 64 year old white man with a history of chronic active Hepatitis C infection, COPD, bipolar disorder, and recent pneumonia admitted with a GI bleed and possible pneumonia who was transferred to the CCU for atrial fibrillation.   RECOMMENDATIONS:  1. His EGD shows no signs of bleeding. His colonoscopy demonstrated melena but no colitis. He began having low-grade temperatures while receiving blood. Evaluation demonstrated a possible transfusion reaction.  2. He did not present with fever. His white count has been mildly elevated although today was a little bit higher. His chest x-ray after several days in the hospital shows an infiltrate but he did not have a chest x-ray on admission. The cuts from the CT scan of his abdomen and pelvis did not show any obvious infiltrate at the time of admission. Possible explanations for his mild temperature elevation and leukocytosis include the GI bleed itself, transfusion reaction, or possibly pneumonia.  3. Would discontinue the Linezolid, levofloxacin, and metronidazole. Would continue the Zosyn alone for now.  4. If he spikes over 101 .5, will get blood cultures, urine culture, urinalysis, and repeat his chest x-ray.  5. Will try and get a sputum culture but he does not appear to  be having significant productive sputum.  6. He has recently been on antibiotics. If he has loose stool, would send a C. difficile PCR.   This is a high level Infectious Disease consult. Thank you very much for involving me in Mr. Solem's care.   ____________________________ Rosalyn GessMichael E. Vashti Bolanos, MD meb:drc D: 04/29/2011 14:01:58 ET T: 04/29/2011 14:16:19 ET JOB#: [id number]  cc: Rosalyn GessMichael E. Debby Clyne, MD, <Dictator> Sumeet E Naylani Bradner MD ELECTRONICALLY SIGNED 05/07/2011 16:15

## 2014-06-30 NOTE — Consult Note (Signed)
Details:    - Psychiatry: I was told by the unit secretary that there was a new consult ordered for this patient, but I do not see it. In any case there does not seem to be any new psychiatric problem. Came by to see patient. He has been extubated so he can talk. Still feeling anxious. Worried about his health. Affect anxious. Interaction was appropriate. Patient lucid and showing reasonable insight. No evident psychosis. Denies any suicidal ideation.  CCU MD already changed depakote back to pill form. No change needed in klonapin. Will add prn xanax 1mg  qhs which is his outpt dose. Zyprexa dose evidently was 25mg  qhs. Will change order back to that.  will follow.   Electronic Signatures: Ariabella Brien, Jackquline DenmarkJohn T (MD)  (Signed [id number]-Feb-13 16:14)  Authored: Details   Last Updated: 25-Feb-13 16:14 by Audery Amellapacs, Sangeeta Youse T (MD)

## 2014-06-30 NOTE — Consult Note (Signed)
Pt colonoscopy showed melena throughout colon.  Source either small bowel or possible varices at GEJ not visible yesterday due to volume depletion.  Will get Meckel scan today and capsule endo tomorrow.    Electronic Signatures: Scot JunElliott, Leniya Breit T (MD)  (Signed on 19-Feb-13 13:02)  Authored  Last Updated: 19-Feb-13 13:02 by Scot JunElliott, Naja Apperson T (MD)

## 2014-06-30 NOTE — Consult Note (Signed)
Brief Consult Note: Comments: Case discussed with Dr. Fonnie BirkenheadWhiting last night and IV PPI, holding Xetralto and PRBC transfusion recommended. Case discussed with Dr. Mechele CollinElliott this morning and Dr. Mechele CollinElliott has kindly agreed to see him in consult and probably perform an EGD today, if acceptable, as planned earlier. Further recommendations per Dr. Mechele CollinElliott. Thanks.  Electronic Signatures: Lurline DelIftikhar, Vilma Will (MD)  (Signed 18-Feb-13 10:10)  Authored: Brief Consult Note   Last Updated: 18-Feb-13 10:10 by Lurline DelIftikhar, Aasia Peavler (MD)

## 2014-06-30 NOTE — Consult Note (Signed)
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 This hospital coarse, he has had fevers, GI workup revealing no source (melena on colo, no source on EGD), periods of atrial fib. Cardiology was consulted for atrial fibrillation.  Tele shows he had a long run of atrial fib yesterday, not able to convert on amio PO, changed to amio ggt. Converted to NSR last night. He was very aggitated last night with no improvement on ativan. Also some SOB. He was started on presedex ggt and Bipap. This AM is not arousable, mild hypotension secondary to sedation. HCT stable.   Prior to arrival, He had burgundy red bowel movement, hemoglobin of 7.9, BUN elevated at 29 and INR was up at 2.0.  Significant pain med seeking on his last hospital visit.    PAST MEDICAL HISTORY:  1. Recent hospitalization, discharge on 04/13/2011, was diagnosed with pneumonia, rhabdomyolysis, atrial fibrillation with rapid ventricular response.  2. History of aortic stenosis (bicuspid), undergoing evaluation replacement. 3. History of hypertension. 4. Chronic obstructive pulmonary disease, does have oxygen at home but currently pulse oximetry is good off oxygen. 5. Anxiety disorder. 6. Chronic back pain. 7. Bipolar disorder.  8. Anemia.  9.           No significant CAD by recent cardiac cath    Present Illness . PAST SURGICAL HISTORY:  1. Two sinus surgeries. 2. Two knee surgeries.  3. Carpal tunnel surgery.   MEDICATIONS:  1. Nexium 40 mg daily.  2. Advair Diskus 250/50, 1 inhalation twice a day.  3. Depakote 1500 mg daily.  4. Daliresp 500 mg daily.  5. Aspirin 81 mg 2 tablets daily.  6. Clonazepam 2 mg twice a day.  7. BuSpar 15 mg twice a day.  8. Spiriva 1 inhalation daily.  9. Gabapentin 600 mg q.i.d.  10. Olanzapine 25 mg at bedtime.  11. Alprazolam 1 mg twice a day.   12. Valium 10 mg daily.  13. Norco 5/325, 1 tablet every six hours as needed for pain.  14. Metoprolol 50 mg every 12 hours. 15. Combivent as needed for shortness of breath.  16. Amiodarone 200 mg twice a day.  17. Xarelto 20 mg p.o. daily.   ALLERGIES: No known drug allergies.   SOCIAL HISTORY: Quit smoking one week ago. No alcohol. No drug use. Is on disability. Lives alone.   FAMILY HISTORY: Father died of heart trouble. Mother died at 41 of chronic medical issues.   Physical Exam:   GEN WD, WN, on bipap, sedated    HEENT pink conjunctivae    NECK supple  No masses    RESP normal resp effort  rhonchi    CARD Regular rate and rhythm  Murmur    Murmur Systolic    Systolic Murmur Out flow    ABD soft    LYMPH negative neck    EXTR negative edema    SKIN normal to palpation    NEURO unable to test    [facility] sedated   Review of Systems:   ROS Pt not able to provide ROS     Routine Hem:  22-Feb-13 03:25    WBC (CBC) 23.4   RBC (CBC) 3.43   Hemoglobin (CBC) 9.4   Hematocrit (CBC) 29.0   Platelet Count (CBC) 305   MCV 85   MCH 27.5   MCHC 32.5   RDW 17.2  Routine Chem:  22-Feb-13 03:25    Glucose, Serum 108   BUN 8   Creatinine (comp) 0.86   Sodium, Serum 145   Potassium, Serum 3.6   Chloride, Serum 104   CO2, Serum 30   Calcium (Total), Serum 8.3  Hepatic:  22-Feb-13 03:25    Bilirubin, Total 0.4   Alkaline Phosphatase 39   SGPT (ALT) 11   SGOT (AST) 20   Total Protein, Serum 6.5   Albumin, Serum 2.7  Routine Chem:  22-Feb-13 03:25    Osmolality (calc) 288   eGFR (African American) >60   eGFR (Non-African American) >60   Anion Gap 11  TDMs:  22-Feb-13 03:25    Valproic Acid, Serum 82  Routine Hem:  22-Feb-13 03:25    Neutrophil % 85.3   Lymphocyte % 6.4   Monocyte % 8.2   Eosinophil % 0.1   Basophil % 0.0   Neutrophil # 19.9   Lymphocyte # 1.5   Monocyte # 1.9   Eosinophil # 0.0   Basophil # 0.0   EKG:   Interpretation  EKG: atrial fibrillation with RVR, nonspecifica ST ABN THis AM, tele shows NSR   Radiology Results: XRay:    20-Feb-13 05:50, Chest PA and Lateral   Chest PA and Lateral    REASON FOR EXAM:    Hypoxia  COMMENTS:       PROCEDURE: DXR - DXR CHEST PA (OR AP) AND LATERAL  - Apr 28 2011  5:50AM     RESULT:     Comparison is made to a prior exam of 04/11/2011.     The current exam shows a diffuse infiltrate in the left base compatible   with pneumonia. The findings are more prominent than were evident on the   exam of 04/11/2011. The right lung field at this time appears clear. The   heart size is normal. The chest is bilaterally hyperinflated which   suggests a history of COPD.    IMPRESSION:     1.  There is a diffuse left lower lobe infiltrate compatible with   pneumonia.  2.  Probable COPD.       Thank you for this opportunity to contribute to the care of your patient.           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 This hospital coarse, with fevers, GI workup revealing no source (melena on colo, no source on EGD), periods of atrial fib. In NSR on amio ggt. On bipap overnight and sedation for aggitation overnight.   A/P: 1) Atrial fibrillation Suspect secondary to underlying anemia, possible PNA Back in NSR on AMio  ggt As BP has been low though his hospital coarse, will hold  diltiazem PO Once taking POs, could restart digoxin Once BP will tolerate, could restart low dose metoprolol  2) SOB: on bipap for hypoxia CXR suggesting LLL PNA UNable to exclude diastolic CHF from severe aortic valve stenosis and rapid atrial fib, anemia Will give lasix 20 mg IV x 1 this AM BP should improve after holding sedation  3) Aggitation: History of pain medication dependence Last admission was seeking pain meds for "chest pain from his heart valve" Withdrawal? May need to restart CIWA protocol  4) Severe aortic valve stenosis Seen by Dr. Roxy Manns. Given GI bleed, may benefit from bioprothetic valve  5) PNA (on CXR) on zosyn   Electronic Signatures: Ida Rogue (MD)  (Signed [phone] 08:34)  Authored: General Aspect/Present Illness, History and Physical Exam, Review of System, Home Medications, Labs, EKG , Radiology, Allergies, Vital Signs/Nurse's Notes, Impression/Plan   Last Updated: 22-Feb-13 08:34 by Ida Rogue (MD)

## 2014-06-30 NOTE — Consult Note (Signed)
Agree with plan for treatment with Flagyl  for several days after finishing his Zosyn.  Since he had a colon prep the day of the C. diff positive test it raises the question of colonization versus infection.  Given the need for a broad spectrum antibiotic like Zosyn there is no way we can not treat the C. diff.  Expect at least a 20% possibility of relapse.  Any outpatient new diarrhea should prompt a C. diff stool test.  Electronic Signatures: Scot JunElliott, Nova Schmuhl T (MD)  (Signed on 27-Feb-13 14:01)  Authored  Last Updated: 27-Feb-13 14:01 by Scot JunElliott, Masai Kidd T (MD)

## 2014-06-30 NOTE — Consult Note (Signed)
EGD essentially negative, done with anesthesiologist.  Plan colonoscopy tomorrow.  Electronic Signatures: Scot JunElliott, Robert T (MD)  (Signed on 18-Feb-13 13:33)  Authored  Last Updated: 18-Feb-13 13:33 by Scot JunElliott, Robert T (MD)

## 2014-06-30 NOTE — Consult Note (Signed)
Brief Consult Note: Diagnosis: GI bleed, periumbilical pain, weight loss, poor appetite, anemia s/p transfusion, severe aortic stenosis.   Patient was seen by consultant.   Consult note dictated.   Recommend further assessment or treatment.   Orders entered.   Comments: EGD for first case this afternoon. Records reviewed from cardiologist with anesthesia and Dr. Mechele CollinElliott.  Electronic Signatures: Rowan BlaseMills, Keriana Sarsfield Ann (NP)  (Signed [phone]-Feb-13 11:31)  Authored: Brief Consult Note   Last Updated: 18-Feb-13 11:31 by Rowan BlaseMills, Yavuz Kirby Ann (NP)
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
## 2014-06-30 NOTE — Consult Note (Signed)
Pt with chills now, chest exam shows no pneumonia, but infiltrate seen on CXR on left, started on Levaquin.  Nurse reports trouble swallowing with saliva pooling up in throat.  May need speech pathology consult.  Due to fever and possible transfusion reaction last night he did not get prep this early morning and therefore did not get capsule done today.  His Meckel scan was negative. cardiology consult due to severe valvular stenosis. do capsule endo when overall status able to do prep.  If further acute bleed occurs get stat GI bleeding scan.  Electronic Signatures: Scot JunElliott, Robert T (MD)  (Signed on 20-Feb-13 17:19)  Authored  Last Updated: 20-Feb-13 17:19 by Scot JunElliott, Robert T (MD)

## 2014-06-30 NOTE — Consult Note (Signed)
Impression: 64yo WM w/ h/o chronic active hep C infection, COPD, bipolar disorder, recurrent pneumonia and recurrent C.diff colitis admitted multiple Methacillin Resistant Staph aureus abscesses and C. diff colitis. He has had the facial lesion I&D'd.  Culture growing Methacillin Resistant Staph aureus.  He has several other lesions on his scalp and legs as well.  I do not think a topical treatment would be adequate. Will change his IV vanco to linezolid po.  Would treat for total of 10 days (seven day more).  Continue oral vanco to treat the C. diff.  Would give him 10 days after he completes the linezolid (17 days more). He already has an appointment with Dr. Bluford Kaufmannh in GI.  He may wish to consider stool transfer due to his recurrent episodes of C. diff colitis. Would try to avoid further antibiotics in the future, but Methacillin Resistant Staph aureus lesions are often recurrent. Continue contact isolation.  Electronic Signatures: Anastyn Ayars, Rosalyn GessMichael E (MD)  (Signed on 01-Jul-13 16:17)  Authored  Last Updated: 01-Jul-13 16:17 by Buren Havey, Rosalyn GessMichael E (MD)

## 2014-06-30 NOTE — Consult Note (Signed)
PATIENT NAME:  Wayne Guzman, Abdou L MR#:  [id number] DATE OF BIRTH:  07/04/1950  DATE OF CONSULTATION:  09/06/2011  REFERRING PHYSICIAN:  Dr. Dava NajjarPanwar  CONSULTING PHYSICIAN:  Rosalyn GessMichael E. Clemmie Buelna, MD  REASON FOR CONSULTATION: MRSA abscesses and C. difficile colitis.   HISTORY OF PRESENT ILLNESS: The patient is a 64 year old white man with a history of chronic active Hepatitis C infection, COPD, bipolar disorder, recurrent pneumonia, and recurrent C. difficile who was admitted with a four to five day history of multiple skin lesions. The patient has noted lesions on his face, scalp, neck, and legs. He has never had similar lesions before. He describes them as painful, red, swollen, and purulent. He was admitted to the hospital and underwent I and D of the lesion on the left nasal labial fold on 06/27. Cultures are growing MRSA. He has been started on IV vancomycin. He was also initially given Unasyn although this has been stopped. The patient has been also complaining of diarrhea for approximately a month. He had a C. difficile PCR which is positive. He has had some abdominal discomfort but no nausea and vomiting. He has had multiple loose stools. He has been on oral vancomycin during this hospitalization and he has had no loose bowel movements in the last two days. He states that he has had C. difficile twice in the past year after being treated for pneumonia. He is followed by GI as an outpatient.   ALLERGIES: None.   PAST MEDICAL HISTORY:  1. Recurrent C. difficile.  2. Recurrent pneumonia.  3. Chronic active Hepatitis C infection.  4. Aortic stenosis.  5. Bipolar disorder.  6. Panic disorder.  7. Hypercholesterolemia.  8. Chronic COPD.   SOCIAL HISTORY: The patient lives by himself. He has a cat at home. He smokes approximately three cigarettes per day. He is a prior alcoholic having quit 12 years ago. He has a prior injecting drug use habit but none recently.   FAMILY HISTORY: Positive for  coronary artery disease.  REVIEW OF SYSTEMS: GENERAL: No fevers, chills, or sweats. No malaise. HEENT: No headaches. No sinus congestion. No sore throat. NECK: No stiffness. No swollen glands. RESPIRATORY: No cough. No shortness of breath. No sputum production. CARDIAC: No chest pains or palpitations. GI: No nausea. No vomiting. Positive abdominal pain. Positive diarrhea, although this is improved with C. difficile therapy. GU: No complaints. MUSCULOSKELETAL: No complaints. SKIN: He has had multiple pustular skin lesions on his legs, scalp, neck, and face. NEUROLOGIC: No focal weakness. PSYCHIATRIC: No complaints. All other systems are negative.   PHYSICAL EXAMINATION:   VITAL SIGNS: T-max of 101.4, T-current 98.3, pulse 78, blood pressure 102/68, 97% on room air.   GENERAL: 64 year old white man in no acute distress.   HEENT: Normocephalic, atraumatic. Pupils equal, reactive to light. Extraocular motion intact. Sclerae, conjunctivae, and lids are without evidence for emboli or petechiae. Oropharynx shows no erythema or exudate. Teeth and gums are in fair condition.   NECK: Supple. Full range of motion. Midline trachea. No lymphadenopathy. No thyromegaly.   CHEST: Clear to auscultation bilaterally. Good air movement. No focal consolidation.   CARDIAC: Regular rate and rhythm with a 3/6 systolic murmur heard along the left sternal border. No peripheral edema.   GI: Soft, nontender, and nondistended. No hepatosplenomegaly. No hernia is noted.   EXTREMITIES: No evidence for tenosynovitis.   SKIN: He has an open lesion on the left nasolabial fold. There was some serous drainage that could be expressed from this. The  area was minimally tender. There was no significant surrounding erythema and no significant edema. There were two lesions in the scalp, one on the right occipital area and one on the posterior aspect of the scalp that were swollen with some honeycomb-like drainage. There was no  fluctuance noted. The areas were moderately tender. In addition, there were several scattered lesions over the legs one of which was a small pustule that appeared to be fairly superficial. The others appeared to be more healing lesions. None of them had significant surrounding erythema nor significant tenderness. There were no stigmata of endocarditis, specifically no Janeway lesions or Osler nodes.   NEUROLOGIC: The patient was awake and interactive, moving all four extremities.   PSYCHIATRIC: Mood and affect appeared normal.   LABORATORY DATA: BUN 9, creatinine 0.87, bicarbonate 31, anion gap 6. LFTs were within normal limits on 06/27. White count from 06/29 was 11.7 with hemoglobin 8.4, platelet count 263, ANC 7.4. White count on admission was 20.2. Clostridium difficile PCR from 06/25 is positive. Stool cultures are negative. Blood cultures from 06/27 are negative. A wound culture from 06/28 is growing MRSA. A C. difficile from 06/29 was positive. Urinalysis was fairly unremarkable from admission. Giardia and O and P were negative.   A maxillofacial CT scan with contrast showed left periorbital cellulitis and a small abscess in the subcutaneous tissue on the inferior aspect of the left nasal region.   IMPRESSION: This is a 64 year old white man with a history of chronic active Hepatitis C infection, COPD, bipolar disorder, recurrent pneumonia and recurrent C. difficile colitis admitted with multiple Methicillin-resistant Staphylococcus aureus abscesses and C. difficile colitis.   RECOMMENDATIONS:  1. He has had the facial lesion drained. Cultures are growing MRSA. He has several of the lesions in the scalp and on the legs as well. I don't think a topical treatment will be adequate.  2. Will change his IV vancomycin to Linezolid p.o. Will treat for a total of 10 days (seven days more).  3. Will continue oral vancomycin to treat the C. difficile. Will give him 10 days after he completes the  Linezolid (seven days more).  4. He already has an appointment with Dr. Bluford Kaufmann in GI. He may wish to consider stool transfer due to his recurrent episodes of C. difficile colitis.  5. Would try to avoid further antibiotics in the future but Methicillin-resistant Staphylococcus aureus lesions are oftentimes recurrent.  6. Would continue contact isolation.       This is a moderately complex Infectious Disease case.   ____________________________ Rosalyn Gess. Cassi Jenne, MD meb:drc D: 09/06/2011 16:26:52 ET T: 09/06/2011 16:56:19 ET JOB#: [id number]  cc: Rosalyn Gess. Nirvana Blanchett, MD, <Dictator> Ezzard Standing. Bluford Kaufmann, MD Chaske E Nashae Maudlin MD ELECTRONICALLY SIGNED 09/08/2011 8:08

## 2014-06-30 NOTE — Consult Note (Signed)
PATIENT NAME:  Joellyn HaffCHURCH, Andrea L MR#:  [id number] DATE OF BIRTH:  1950/05/28  DATE OF CONSULTATION:  04/09/2011  REFERRING PHYSICIAN:   CONSULTING PHYSICIAN:  Laurier NancyShaukat A. Crixus Mcaulay, MD  HISTORY OF PRESENT ILLNESS:  This is a 64 year old white male with a past medical history of respiratory failure in December 2011 due to foreign body in the left main bronchus. He came in today because of shortness of breath and chest pain and fever. He apparently was having shaking chills for several days and in the Emergency Room was tachycardic and had a fever of 100.1. He had an elevated CPK, troponin, and MB; thus I was asked to evaluate the patient whether this was rhabdomyolysis versus non-STEMI.  The patient does have chest pain on the left side, but was also found to have left lower pneumonia with left lower lobe infiltrate on chest x-ray. He has some shortness of breath also but is feeling much better since he has been getting IV fluid and antibiotics.   PAST MEDICAL HISTORY:  1. As mentioned, respiratory failure secondary to foreign body dislodgment in the mainstem bronchus which was removed [facility]. 2. History of hypertension.  3. Chronic obstructive pulmonary disease. 4. Anxiety disorder.  5. Bipolar disorder.   ALLERGIES: No known drug allergies.   MEDICATIONS: Advair, aspirin, buspirone, clonazepam.   FAMILY HISTORY: Unremarkable.   PHYSICAL EXAMINATION:  GENERAL: He is alert, oriented times three, in no acute distress right now.   VITAL SIGNS:  Temperature is 97, respirations 20, blood pressure 100/65, saturation 95%.   NECK: No JVD.   LUNGS: Good air entry. No rales or rhonchi.   HEART: Regular rate and rhythm. Normal S1, S2. 2/6 systolic murmur at the left lower sternal border.   ABDOMEN: Soft, nontender, positive bowel sounds.   EXTREMITIES: No pedal edema.   LABS/STUDIES: EKG shows sinus tachycardia, 100 beats per minute, otherwise within normal limits. Initial CPK was 8386,  MB was 19.5, troponin 0.13. Third set is 4200 and MB 5.3 and troponin also remains 0.13.  Platelet count is also low at 160, hemoglobin is also low at 9.6. White count is 10.6.   ASSESSMENT AND PLAN: Atypical chest pain with rhabdomyolysis, mildly elevated troponin and MB secondary to rhabdomyolysis because of the fever and chills and apparently he fell also. EKG is normal. He has a reason to have left-sided chest pain. He has left lower lobe pneumonia. Advise continuation of IV antibiotics and fluids until CPK is normalized. Thank you very much for the referral. No further cardiac work-up is necessary.    ____________________________ Laurier NancyShaukat A. Ziaire Hagos, MD sak:bjt D: 04/09/2011 09:03:02 ET T: 04/09/2011 09:56:35 ET JOB#: [id number]  cc: Laurier NancyShaukat A. Aadvika Konen, MD, <Dictator> Laurier NancySHAUKAT A Cassidy Tabet MD ELECTRONICALLY SIGNED 04/10/2011 11:38

## 2014-06-30 NOTE — Consult Note (Signed)
Impression: 64yo WM w/ h/o chronic active hep C infection, COPD, bipolar disorder, recent pneumonia admitted with GI bleed and possible pneumonia who was transferred to CCU for afib.  His EGD showed no signs of bleeding.  His colonoscopy demonstrated melena, but no colitis.  He began having low grade temps while recieving blood.  Evaluation demonstrated a possible transfusion reaction. He did not present with fever.  His WBC has been mildly elevated.  His CXR shows an infiltrate, but he did not have a CXR on admission.  Possible explanations for her mild temp elevation and leukocytosis include GI bleeding, transfusion reaction or pneumonia.   Would d/c linezolid, levofloxacin zosyn and metronidazole.  Will continue zosyn levofloxacin for now5 day course. If he spike over 101.5, would get BCx, UCx and u/a and repeat the CXR. Will try to get a sputum culture He has recently been on antibiotics.  If he has loose stool, would send C. diff PCR.    Electronic Signatures: Callia Swim, Rosalyn GessMichael E (MD) (Signed on 21-Feb-13 13:47)  Authored   Last Updated: 21-Feb-13 13:53 by Dene Landsberg, Rosalyn GessMichael E (MD)

## 2014-06-30 NOTE — H&P (Signed)
PATIENT NAME:  Wayne Guzman, Devyon L MR#:  [id number] DATE OF BIRTH:  1950-06-24  DATE OF ADMISSION:  09/02/2011  PRIMARY CARE PHYSICIAN: [facility]   CHIEF COMPLAINT: Left-sided fascial swelling.   HISTORY OF PRESENT ILLNESS: The patient is a 64 year old male who has had multiple admissions to the hospital, the last one being in February. At that time, he had acute hypoxic respiratory failure requiring intubation. He also had atrial fibrillation, also had C. difficile, also had a GI bleed. He lives by himself at home. He has had intermittent diarrhea. He had a C. difficile that was negative on June 25th. The patient presents today with complaint of having swelling in the left periorbital area and erythema since yesterday. He reports that it is also hurting a lot. The patient has not felt feverish at home. He does not have any difficulty with his vision. He otherwise denies any chest pain, shortness of breath, palpitations.   PAST MEDICAL HISTORY:  1. Recent hospitalization where he was diagnosed with acute hypoxic respiratory failure due to chronic obstructive pulmonary disease exacerbation. He also had pneumonia, had to be intubated, had an acute on chronic obstructive pulmonary disease flare as well as rapid atrial fibrillation, possible C. difficile.  2. History of aortic stenosis, is being followed at [facility] for possible surgical intervention.  3. Hypertension.  4. Chronic obstructive pulmonary disease, is not on any oxygen.  5. History of C. difficile.  6. Anxiety disorder.  7. Chronic back pain.  8. Bipolar disorder.  9. Anemia.  10. Recent treatment for lice as well as scabies.   PAST SURGICAL HISTORY:  1. Two sinus surgeries.  2. Two knee surgeries. 3. Carpal tunnel surgeries..   ALLERGIES: None.   CURRENT MEDICATIONS:  1. Acetaminophen/hydrocodone 1 tab p.o. every 6 hours p.r.n. pain.  2. Advair Diskus 250/50 INH b.i.d.  3. Amiodarone 200 mg, 1 tab p.o. b.i.d.   4. Aspirin 81 mg, 2 tabs daily.  5. Bactroban 2% topically to affected area t.i.d. 6. Bentyl 10 mg, 1 tab p.o. every 8 hours p.r.n. diarrhea. 7. Bupropion 15 mg, 1 tab p.o. b.i.d.  8. Clonazepam 2 mg, 1 tab p.o. b.i.d.  9. Combivent 18 mcg, 2 to 4 puffs every 6 hours p.r.n.  10. Daliresp 500 mcg p.o. daily.  11. Depakote 750 mg, 1 tab p.o. b.i.d.  12. DuoNebs every 4 hours while awake as needed.  13. Gabapentin 600 mg, 1 tab q.i.d. as needed.   14. Metoprolol tartrate 25 mg, 1 tab p.o. daily.  15. Olanzapine, it is not clear whether he is on 25 mg or 2.5 mg at bedtime.  16. Protonix 40 mg daily.  17. Spiriva 18 mcg inhalation daily.   SOCIAL HISTORY: He continues to smoke about a pack per day. No alcohol or drug use. He is on Disability, lives alone.   FAMILY HISTORY: Father died of heart trouble. Mother died at 8778 from chronic medical issues.    REVIEW OF SYSTEMS: CONSTITUTIONAL: Denies any fevers or chills. No weight gain. No weight loss. EYES: Does wear glasses. Denies any burning or double vision or erythema. EAR, NOSE, MOUTH AND THROAT: No hearing loss. No sore throat. No difficulty swallowing. CARDIOVASCULAR: No chest pain. No palpitations. No syncope. RESPIRATORY: No shortness of breath. No coughing. No wheezing. GI: Complains of having still loose bowel movements. Denies abdominal pain. No hematemesis. No hematochezia. GU: Denies any burning on urination. No hematuria. MUSCULOSKELETAL: Denies any joint pain, muscle pains. Has chronic  back pain. SKIN: Complains of erythema involving the left side of his face. NEUROLOGICAL: Denies any seizure, CVA or transient ischemic attack. PSYCHIATRIC: Has anxiety and bipolar. ENDOCRINE: Denies any polydipsia, polyphagia. HEME/LYMPH: Does have history of anemia. No easy bruisability.  PHYSICAL EXAMINATION:  VITAL SIGNS: Temperature 98.1, pulse 70, respirations 18, blood pressure 156/83, O2 98%.   GENERAL: The patient is a 64 year old male, well  developed, not in any acute distress.   HEENT: Head atraumatic, normocephalic. Pupils are equally round, reactive to light and accommodation. He has a swelling on the left side of his face as well as periorbital swelling with erythema and warmth. Nasal exam shows no drainage or ulceration. Oropharynx is clear without any exudates.   NECK: No thyromegaly. No carotid bruits.   CARDIOVASCULAR: Regular rate and rhythm. There is a systolic murmur at the left sternal border.   ABDOMEN: Soft, nontender, nondistended. Positive bowel sounds x4.   EXTREMITIES: He has no clubbing, cyanosis, or edema.   SKIN: He has got some areas of scaly rash on different parts of his body.   NEUROLOGICAL: Awake, alert, oriented x3. No focal deficits.   PSYCHIATRIC: Not anxious or depressed.   VASCULAR: Good DP, PT pulses.   MUSCULOSKELETAL: There is no erythema or swelling.   LABORATORY, DIAGNOSTIC AND RADIOLOGICAL DATA:  Glucose 87, BUN 9, creatinine 0.87, sodium 136, potassium 4.1, chloride 99, CO2 31, calcium is 9.2.  LFTs are normal.  TUDS shows opioids positive.  WBC 20.2, hemoglobin 10.2, platelet count 342. Maxillofacial CT scan shows tiny fluid collection in the subcutaneous fatty tissue at the inferior left nasal region, 1.4 x 1.2 cm. Mild mucosal thickening.   ASSESSMENT AND PLAN: The patient is a 64 year old male with multiple medical problems who has been in and out of the hospital, presents with left facial swelling and erythema.   1. Left facial cellulitis with associated small abscess: At this time, we will treat him with IV Unasyn and vancomycin. I will ask ENT to see for any further intervention.  2. History of C. difficile: I will start him on probiotics due to him being started on antibiotics.  3. Diarrhea, which has been intermittently chronic: We will go ahead and repeat C. difficile to make sure he does not have C. difficile. 4. History of atrial fibrillation: Continue amiodarone and  aspirin.  5. Chronic obstructive pulmonary disease: We will continue Advair, Daliresp and nebulizers as taking at home.  6. Hypertension: Currently he is hypotensive. He is getting IV fluids. We will hold metoprolol.  7. Aortic valve stenosis: Needs to follow up at [facility] as previously.  8. History of GI bleed: We will continue Protonix.  9. Miscellaneous: We will place him on heparin for deep vein thrombosis prophylaxis.   TIME SPENT: 45 minutes.  ____________________________ Lacie Scotts Allena Katz, MD shp:cbb D: 09/02/2011 20:53:50 ET T: 09/03/2011 06:44:02 ET JOB#: [id number]  cc: Ariyannah Pauling H. Allena Katz, MD, <Dictator> [facility] Charise Carwin MD ELECTRONICALLY SIGNED 09/03/2011 17:08

## 2014-06-30 NOTE — Consult Note (Signed)
PATIENT NAME:  Wayne Guzman, Wayne Guzman MR#:  [id number] DATE OF BIRTH:  1950/05/08  DATE OF CONSULTATION:  09/02/2011  REFERRING PHYSICIAN:   CONSULTING PHYSICIAN:  Zackery BarefootJ. Madison Altair Stanko, MD  HISTORY OF PRESENT ILLNESS: The patient is a 64 year old white male with a history significant for tobacco abuse, bipolar disorder, chronic obstructive pulmonary disease and most recently Clostridium difficile who was admitted with pain, fever and elevated white blood cell count with a left facial cellulitis extending to the left orbit. A CT scan with contrast was obtained. This showed a left preseptal periorbital cellulitis and small abscess at the left alar facial junction. The patient reports that this "feels better than yesterday". He received IV Unasyn and vancomycin.   ALLERGIES, MEDICATIONS, PAST MEDICAL HISTORY, PAST SURGICAL HISTORY: Reviewed and documented in the chart.   PHYSICAL EXAMINATION:  GENERAL: The patient is a pleasant, noncombative but confused white male in no acute distress. He is actually more concerned about the "knots on the back of my head" than the left upper lip.   HEAD AND FACE: There is pigmentation of the forehead and face with erythema extending from the left alar facial groove to the left periorbita. There is palpable firm tenderness surrounding an area of crusted erythema at the alar facial junction.  PROCEDURE: Incision and drainage of left nasal alar facial junction abscess.   DESCRIPTION OF PROCEDURE: The patient was consented and then given 6 mg of IV morphine. A #15 blade was used to make a 2 cm incision around the alar facial junction. Pus was obtained. Culture swab was placed in the pus and iodoform gauze was packed in the wound. The wound was then dressed with 2 x 2 and paper tape. The patient experienced immediate relief of the pressure but is still experiencing pain.   IMPRESSION AND RECOMMENDATIONS: Left nasal alar facial groove abscess with secondary area periorbital  cellulitis. This is most likely staphylococcal and he should be covered with anti-staph medication until the culture results are available. These cultures have been sent and instructions have been put in the computer to call the ENT doctor on call when the culture results are available. If necessary. Dr. Leavy CellaBlocker can be consulted but I would wait until the culture results determine necessity for that consultation. I have recommended that the nurse remove the packing which is approximately 1.5 inches long and is protruding above the wound adequately.   The patient will follow up on a p.r.n. basis and has been instructed not to pick at any of the lesions on his face or head.   ____________________________ J. Gertie BaronMadison Printice Hellmer, MD jmc:cms D: 09/03/2011 16:28:00 ET T: 09/03/2011 17:24:24 ET JOB#: [id number]  cc: Zackery BarefootJ. Madison Laverne Klugh, MD, <Dictator> [facility]  Wendee CoppJMADISON Byrl Latin MD ELECTRONICALLY SIGNED 09/16/2011 22:09

## 2014-06-30 NOTE — Discharge Summary (Signed)
PATIENT NAME:  Wayne Guzman, [name] MR#:  [id number] DATE OF BIRTH:  07-Sep-1950  DATE OF ADMISSION:  04/25/2011 DATE OF DISCHARGE:  05/06/2011  PRIMARY CARE PHYSICIAN: [facility]   Please note, I took care of the patient from 02/23 to 05/05/2011.  CONSULTATIONS:  1. Lynnae Prudeobert Elliott, MD - Gastroenterology. 2. Mordecai RasmussenJohn Clapacs, MD - Psychiatry.  3. Speech Therapy. 4. Erin FullingKurian Kasa, MD - Pulmonary.  5. Orson AloeMichael Blocker, MD - Infectious Disease. 6. Julien Nordmannimothy Gollan, MD - Cardiology.  CONDITION ON DISCHARGE: Fair; the patient has a poor long-term prognosis.   CODE STATUS: FULL CODE.  PRESENTING COMPLAINT: Weak and abdominal pain.   DISCHARGE DIAGNOSES:  1. Acute respiratory failure with hypoxemia failed BiPAP requiring mechanical intubation, on 04/30/2011, status post extubation.  2. Pneumonia, completed 7-day course of Zosyn.  3. Chronic obstructive pulmonary disease flare, on a prednisone taper.  4. Atrial fibrillation, acute on chronic, rapid, now in sinus rhythm on p.o. amiodarone. No anticoagulation due to active gastrointestinal bleed on presentation.  5. Abdominal pain suspected due to Clostridium difficile colitis.  6. Acute on chronic anemia with gastrointestinal status post 2 units blood transfusion. 7. Hypotension, resolved.  8. Aortic stenosis, follows with [facility]othoracic surgery at [facility] for valve replacement in the future.  9. Chronic pain. The patient is off all narcotics, currently only on Ultram p.r.n.  10. Anxiety/depression with history of polysubstance abuse. The patient is currently on Klonopin and Depakote.  11. Dysphagia, now on mechanical diet with nectar thick liquids.   DISCHARGE MEDICATIONS: 1. Flagyl 500 mg p.o. three times daily for 10 more days.  2. Prednisone taper.  3. Olanzapine 25 mg at bedtime.  4. Alprazolam 1 mg at bedtime p.r.n.  5. Depakote 750 mg p.o. twice a day. 6. Spiriva HandiHaler daily.  7. Advair 250/50 one puff twice a  day. 8. Protonix 40 mg daily.  9. Klonopin 1 mg at noon.  10. Metoprolol 25 mg daily.  11. Amiodarone 200 mg twice a day. 12. Ultram 50 mg three times daily p.r.n.  13. Gabapentin 600 mg four times daily. 14. Klonopin 2 mg daily at 6 in the morning and 6 in the evening.  15. Buspirone 15 mg twice a day. 16. Tylenol 650 mg p.o. every four hours p.r.n.  17. Daliresp 500 mcg p.o. daily.  18. Physical therapy.  19. Aspiration precautions.  20. DuoNebs every four hours p.r.n.   OXYGEN: 2 liters per minute continuous. Respiratory therapy to wean as tolerated, if the patient keeps saturations more than 92%.   DIET: Mechanical soft diet, nectar thick liquid, 2 grams sodium diet.  DISCHARGE FOLLOWUP: The patient is to followup Dr. Dietrich Patesothbart with [facility] in [place] in 10 to 14 days. Call [phone] for an appointment. The patient will also need a GI appointment with Dr. Mechele CollinElliott in three weeks for GI bleed work-up and possible capsule endoscopy down the road.   PROCEDURES: Central line placement on 04/30/2011. Bronchoscopy on 04/30/2011 showed some pus-looking discharge from left lower lobe.   Colonoscopy on 04/27/2011, by Dr. Mechele CollinElliott, showed internal hemorrhoids. No other source of bleeding noted.   Upper GI endoscopy showed reflux esophagitis. Normal stomach. Normal duodenum.  2 unit blood transfusion was given during the hospital stay.   BRIEF SUMMARY OF HOSPITAL COURSE: [name] is a 64 year old Caucasian gentleman who has multiple medical problems with history of hypertension, chronic obstructive pulmonary disease, and aortic stenosis with work-up in progress at [facility]acic with history of chronic  atrial fibrillation, on Xarelto, anemic, chronic back pain, anxiety and polysubstance abuse who was admitted with abdominal pain and near syncope. He was admitted with:  1. Acute hypoxic respiratory failure: He was started IV Solu-Medrol, breathing treatments, and  oxygen treatment. He failed BiPAP and required mechanical intubation on 04/30/2011 secondary to underlying pneumonia. He underwent bronchoscopy on 04/30/2011 which showed pus in the lower lobes, by Dr. Erin Fulling. The bronchial culture did not grow any organism. It did show some yeast. The patient finished up a course of IV Zosyn for a total of seven days. He was followed by Dr. Leavy Cella. His pneumonia did get better. He does not have a fever. His white count normalized. 2. On chronic obstructive pulmonary disease flare: The patient was placed on mechanical ventilation. Steroids were continued along with inhalers. The patient will now taper off his p.o. steroid, use his inhalers, and will be sent out on oxygen which can be weaned off at the skilled facility.  3. Acute on chronic rapid atrial fibrillation, in the setting of acute illness and respiratory failure:  The patient was on amiodarone drip. Once he was extubated, he was put on p.o. amiodarone and low dose beta blocker was added. The patient currently is in sinus rhythm. He was seen by Dr. Mariah Milling while in-house and we will follow up with Dr. Dietrich Pates in [place]. The patient's anticoagulation was discontinued since he had some active GI bleed in the form of melena and hematochezia, although GI work-up was negative. The patient will be off any anticoagulation at present. The risk of stroke has been discussed with the patient and he understands it.  4. Abdominal pain suspected due to C. difficile colitis: CT of the abdomen was done of the pelvis which showed colitis, infectious/inflammatory changes. He is on Flagyl for C. difficile colitis for 10 more days.  5. Acute on chronic anemia with GI bleed, status post 2 units blood transfusion: He had questionable transfusion reaction, however, work-up per pathology on transfusion reaction remained negative. Colonoscopy showed internal hemorrhoids and upper GI endoscopy shows reflux esophagitis. He did not have  any further symptoms. No anticoagulation at this time. The patient will need follow up with gastroenterology for possible video capsule endoscopy. 6. Aortic stenosis: The patient is followed by cardiothoracic surgery at [facility], which will be set up at a later date once he is discharged from rehab.  7. Chronic pain with substance abuse: The patient was taken off all the narcotics and currently is only on Ultram p.r.n.  8. Anxiety/depression: The patient was seen by Dr. Toni Amend and recommended to continue his Klonopin and Depakote. 9. Dysphagia: He was followed by speech therapy. Modified barium swallow study results are as noted. He is currently on a mechanical soft diet with nectar thick liquid and there was no clear-cut aspiration noted.   Due to the patient's medical illness and chronic medical problems along with generalized deconditioning, the patient will be sent to rehab in [place]. His hospital stay was prolonged, but he has remained stable. The patient currently is FULL CODE.   TIME SPENT: 40 minutes. ____________________________ Wylie Hail Allena Katz, MD sap:slb D: 05/06/2011 09:42:00 ET T: 05/06/2011 10:57:41 ET JOB#: [id number]  cc: [facility] Audery Amel, MD Rosalyn Gess. Blocker, MD Antonieta Iba, MD Scot Jun, MD Dory Larsen, MD Willow Ora MD ELECTRONICALLY SIGNED 05/12/2011 6:25

## 2014-06-30 NOTE — Consult Note (Signed)
Details:    - Psychiatry: Came by to round and found pt had been intubated today. I spoke with his outpt psychiatrist earlier today and confirmed his usual outpt benzos are klonapin 2mg  bid plus xanax 1mg  qD prn and that she has not felt he abused medication.   Depakote level= 82, which is generally a good level for use in mood stabilization  Plan: If possible I'd suggest continuing the klonipin 2mg  bid plus 1mg  midday by ngt. I think by keeping this constant it will make dosing versed easier and make the transition to extubation easier.  I will switch the depakote to liquid form and order it as divided dose for now since the extended release depakote shouldn't be crushed. Liquid VPA will be easier to administer by ngt.   Electronic Signatures: Audery Amellapacs, Celestine Bougie T (MD)  (Signed [id number]-Feb-13 17:53)  Authored: Details   Last Updated: 22-Feb-13 17:53 by Audery Amellapacs, Darlen Gledhill T (MD)

## 2014-06-30 NOTE — Discharge Summary (Signed)
PATIENT NAME:  Wayne Guzman, Wayne Guzman MR#:  [id number] DATE OF BIRTH:  [birth year]  DATE OF ADMISSION:  04/08/2011 DATE OF DISCHARGE:  04/13/2011  ADMITTING DIAGNOSIS: Fevers, chills, chest pressure.   DISCHARGE DIAGNOSES:  1. SIRS likely due to pneumonia.  2. Acute rhabdomyolysis of unclear cause. CPK trending down.  3. Chest pain. The patient had a recent cardiac catheterization in 01/2011. No significant coronary artery disease. He has a bicuspid aortic valve and is being evaluated for replacement.  4. Chronic obstructive pulmonary disease without any acute exacerbation.  5. Atrial fibrillation with RVR likely due to a combination of aortic valve stenosis as well as pulmonary disease. The patient had to be on amiodarone drip, now on p.o. amiodarone. He is not a good anticoagulation candidate so he will be on Xarelto 20 mg daily.  6. Bipolar disorder with anxiety.  7. Elevated transaminase likely due to elevated muscle enzymes, trending down.  8. Chronic pain syndrome.  9. Hypertension. Blood pressure was borderline low here.  10. Chronic back pain.  11. Recent diagnosis of temporal mandibular joint dysfunction.  12. Anemia likely due to anemia of chronic disease.  PERTINENT LABORATORY, DIAGNOSTIC, AND RADIOLOGICAL DATA: Chest x-ray showed left lower lobe pneumonia. Influenza A and B were negative. Urinalysis negative for urinary tract infection. TUDS were positive for benzodiazepines. CT scan of the head showed no acute intracranial processes. WBC count 11.1, hemoglobin 10.1, platelet count 169, ASGPT 81, ASGOT was 465, albumin 3.3. CPK was 8386. Troponin 0.13. INR was 13.5. Serum alcohol level 0.003. Most recent BMP on 02/04: Potassium 3.7, creatinine 0.85. WBC count on 02/04 was 8.6, hemoglobin 9.4. Abdominal ultrasound shows unremarkable abdominal ultrasound.   CONSULTANTS:  1. Dr. Kristeen Miss   2. Dr. Mariah Milling  HOSPITAL COURSE: Please see the History and Physical done by the admitting  physician. The patient is a 64 year old white male with multiple medical problems who came to the ED after neighbors found the patient. The patient was bedbound for a few days with fevers, chills, and poor p.o. intake. He was seen in the ED and was noted to left lower lobe pneumonia. The patient was admitted for SIRS. He was started on IV antibiotics with Levaquin. He was noted to have atrial fibrillation with rapid ventricular response after he was hospitalized. He had to be transferred to the Critical Care Unit and was placed on amiodarone drip.  PROBLEM LIST: 1. SIRS felt to be due to pneumonia: The patient was treated with IV antibiotics. The patient does have a history of having a foreign object removed from his lungs in the past. He presented with fevers and pneumonia-like symptoms.  He was treated with Levaquin. He has been afebrile.  2. Atrial fibrillation with rapid ventricular response:  The patient had to be placed on amiodarone drip. It was felt to be due to aortic valve stenosis, which he has been planned to have repair of, as well as his pulmonary issues. The patient was switched over to p.o. amiodarone and metoprolol. His heart rate has been stable. Unfortunately, he is not a good Coumadin candidate at this time. Xarelto has been prescribed.  3. Chest pain, pressure: The patient had a recent cardiac catheterization which revealed no significant coronary artery disease. His chest pain is felt to be atypical, possible pain-seeking behavior is noted. At this time, the patient is stable for discharge.   DISCHARGE MEDICATIONS:  1. Esomeprazole 40 daily.  2. Advair Diskus 250/50 INH b.i.d.  3. Depakote 1500 mg  daily.  4. Daliresp 500 daily.  5. Aspirin 81 mg, 2 tabs daily.  6. Clonazepam 2 mg, 1 tab p.o. b.i.d.  7. Buspirone 15 mg 1 tab p.o. b.i.d.  8. Spiriva 18-mcg inhalation daily.  9. Gabapentin 600, 1 tab p.o. q.i.d. as needed. 10. Olanzapine 10 mg, 2.5 tabs once daily at bedtime.   11. Alprazolam 1 mg, 1 tab p.o. b.i.d.  12. Diazepam 10 daily.  13. Norco 5/325 p.o. q. 6 p.r.n. pain.  14. Metoprolol tartrate 50 mg p.o. q. 12 hours.  15. Combivent metered-dose inhaler 2 to 4 puffs q. 6 p.r.n. shortness of breath.  16. Amiodarone 200 p.o. b.i.d.  17. Levaquin 500 mg p.o. daily times five days. 18. Xarelto 20 mg p.o. daily.   DIET: Low sodium.   ACTIVITY: As tolerated.   TIMEFRAME FOR FOLLOWUP: 1 to 2 weeks with [facility], Dr. Mariah MillingGollan in one week, Dr. Tressie Stalkerlarence Owen as soon as possible for aortic replacement evaluation.  TIME SPENT: 35 minutes.  ____________________________ Lacie ScottsShreyang H. Allena KatzPatel, MD shp:bjt D: 04/13/2011 09:32:46 ET T: 04/13/2011 11:59:31 ET JOB#: [id number]  cc: Jovita Persing H. Allena KatzPatel, MD, <Dictator> [facility], [facility] Molinda BailiffH Gabi Mcfate MD ELECTRONICALLY SIGNED 04/24/2011 10:01

## 2014-06-30 NOTE — Consult Note (Signed)
Tmax 101.1, other VSS, afebrile now.  When he awakens he immediately requests pain pills, each time I have seen him.  Chest clear on left, right with some ronchi esp with exp. Abd soft, non tender, no masses.  Hgb noted.  No active bleeding.  Although he could develop C. diff, there was no sign of it on colonoscopy.  Melena throughout the colon despite prep. Small bowel AVM,. Dieulafoy, neoplasm all possible.  Not in good enough shape to continue work up but when he improves it would be prudent to do a capsule endoscopy.  Electronic Signatures: Scot JunElliott, Manette Doto T (MD)  (Signed on 21-Feb-13 17:08)  Authored  Last Updated: 21-Feb-13 17:08 by Scot JunElliott, Mivaan Corbitt T (MD)

## 2014-06-30 NOTE — Consult Note (Signed)
Brief Consult Note: Diagnosis: bipolar disorder.   Patient was seen by consultant.   Consult note dictated.   Recommend further assessment or treatment.   Orders entered.   Comments: Psychiatry: Patient seen. Chart reviewed. Discussed case with family. Patient currently complains of anxiety. Has some tremor. No sign of delirium. He has been on chronic benzos for a long time. True prescribed dose not altogether clear but prob at least klonopin 2mg  2-3 times a day. I have left a message for Dr Katrinka BlazingSmith, his outpt psychiatrist but not yet spoken to her. I suggest continue klonopin 2mg  bid plus I will add 1 mg at noon to help keep him calm. If this is too much we will see tomorrow. Meanwhile will dc valium and xanax to simplify benzos. Also will dc CIWA. He has not been drinking and there is no need to detox anything.  Electronic Signatures: Audery Amellapacs, John T (MD)  (Signed 21-Feb-13 13:26)  Authored: Brief Consult Note   Last Updated: 21-Feb-13 13:26 by Audery Amellapacs, John T (MD)

## 2014-06-30 NOTE — Consult Note (Signed)
PATIENT NAME:  Wayne Guzman, Wayne Guzman MR#:  [id number] DATE OF BIRTH:  November 11, 1950  DATE OF CONSULTATION:  04/26/2011  REFERRING PHYSICIAN:  Dr. Harvest DarkIftikhar/Dr. Renae GlossWieting  CONSULTING PHYSICIAN:  Ranae PlumberKimberly A. Arvilla MarketMills, ANP/Dr. Lynnae Prudeobert Elliott   CARDIOLOGIST: Dr. Marca Anconaalton McLean [facility], [facility]   REASON FOR CONSULTATION: Gastrointestinal bleed with need for upper endoscopy evaluation.   HISTORY OF PRESENT ILLNESS: This 64 year old patient reports over the last year or so progressive discomfort in the periumbilical area described as a deep ache. He says food seems to help it. He is convinced he has an ulcer. Patient has been passing blood in the stool, maroon, black stools intermittent for some time now. He has had had problems with anemia, has been placed on oral iron therapy in December. He had hemoglobin of 14 on 03/15/2011. Patient was set up for outpatient EGD, colonoscopy for investigation prior to moving forward with aortic valve replacement. [facility] with history as noted.   He reports Friday, he started to feel presyncopal, abdominal pain felt worse like somebody was punching him. Cold liquids seem to help, food seemed to help, he has had poor appetite, some early satiety, no vomiting or nausea or diarrhea. He has had 20 to 30 pounds weight loss over the last year. Patient was hospitalized for a month at [facility] last year for what he described as a large pill that he aspirated. Patient was on a ventilator, had a feeding tube, as he was quite sick for the month. He says a lot of weight came off at that time but he has just never returned to baseline. He thinks he is still losing weight. What made him come to the Emergency Room was that he started passing more intense bloody bowel movements. In the Emergency Room he had a hemoglobin of 7.9 with a BUN of 29 and INR was elevated. Patient was started on Xarelto 04/13/2011.   PAST MEDICAL HISTORY:   1. Recent hospitalization discharge 04/13/2011 with pneumonia, rhabdomyolysis, atrial fibrillation with rapid ventricular response.  2. Aortic stenosis with cardiac catheterization results dated 01/22/2011 showing aortic valve mean gradient of 31 mmHg, aortic valve peak to peak gradient 36 mmHg, aortic valve area ([place]) 0.9 sq cm., ejection fraction was normal per previous TEE study 2009. MRA of the chest showed no aortic aneurysm performed because of bicuspid valve.  3. Iron deficiency anemia.  4. Anxiety/panic disorder.  5. Bipolar disorder.  6. Hepatitis C virus active with HCV RNA by PCR showing [id number] international units/ mL, genotype 1a.  7. History of aspiration pneumonia/pneumonitis with prolonged ventilation, [facility].  8. Subclavian stenosis.  9. Hyperlipidemia.  10. Chronic back pain.  11. Chronic obstructive pulmonary disease, does have oxygen at home but currently not utilizing.   PAST SURGICAL HISTORY:  1. Carpal tunnel surgery on the right 2012.  2. Left knee surgery arthroscopic x2.  3. Sinus surgery x2.  4. Inguinal hernia repair.   MEDICATIONS:  1. Nexium 40 mg daily.  2. Advair Diskus 250/50, 1 inhalation twice daily. 3. Depakote 1500 mg daily. 4. Daliresp 500 mg daily. 5. Aspirin 81 mg 2 tablets daily.  6. Clonazepam 2 mg twice daily.  7. BuSpar 15 mg twice daily.  8. Spiriva 1 inhalation daily.  9. Gabapentin 600 mg 4 times daily.  10. Olanzapine 25 mg at bedtime.  11. Alprazolam 1 mg twice daily.  12. Valium 10 mg daily, states he  is no longer taking.  13. Norco 5/325, 1 tablet every six hours as needed for pain; says he ran out weeks ago.  14. Metoprolol 50 mg every 12 hours.  15. Combivent as needed for shortness of breath.  16. Amiodarone 200 mg twice daily.  17. Xarelto 20 mg daily, began 04/13/2011.   ALLERGIES: No known drug allergies.   SOCIAL HISTORY: Positive tobacco; says he smokes 1 pack per day, now  down to 2 to 3 cigarettes and just discontinued a week ago altogether. History of alcoholism. He quit altogether 12 years ago. Patient denies any drug use. Records reviewed from [facility] office visit patient had reported  recreational drug use, marijuana and IV cocaine use, not used for 40 years. He had denied tattoos and was incarcerated for DWI 12 years ago. 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   FAMILY HISTORY: Father deceased with heart trouble, 61. Mother deceased at age 70 with chronic medical issues. Negative for colorectal polyp or neoplasm.   REVIEW OF SYSTEMS: CONSTITUTIONAL: Positive for some hot and cold sweats on Friday. Did not check temperature. Positive for weight loss, 20 to 30 pounds, started last year. Reports progressive, positive for weakness, anorexia. HEENT: Has missing tooth that just fell out. Remainder negative for hearing loss, throat problems, difficulty swallowing. Denies any dysphagia. CARDIOVASCULAR: Can tell that he eight atrial fibrillation, amiodarone is working well, no chest pain. RESPIRATORY: Has had a little shortness of breath, no cough or sputum production.  GASTROINTESTINAL: Positive for some nausea, decreased appetite. No vomiting. Periumbilical pain and bloody bowel movement. Consistency of bowel movement is anywhere from formed to loose to soft. GENITOURINARY: Denies any burning, urgency or frequency or hematuria. MUSCULOSKELETAL: No joint pain or muscle pain. Has chronic back pain, just had a shot placed a month ago. Has had chronic pain medication use. INTEGUMENT: No skin changes, rashes, telangiectasia. PSYCH: States he has had problems with panic attacks, currently stable. NEUROLOGIC: He denies any seizures, tremors. ENDOCRINE: Denies any diabetes, thyroid problems. HEMATOLOGIC: Fairly new diagnosis of anemia, with baseline hemoglobin 14 range 03/15/2011.   PHYSICAL EXAMINATION:  VITAL SIGNS: Temperature 97.9, pulse 54, respiratory rate 18,  blood pressure 104/66, 98% on room air pulse oximetry sat.   GENERAL: Thin Caucasian male resting in bed in no acute distress.   HEENT: Head is normocephalic. Conjunctivae is pink. Sclera is anicteric. Oral mucosa is moist and intact. He has a missing bottom tooth, teeth in good repair.   NECK: Supple without lymphadenopathy, thyromegaly.   CARDIAC: S1, S2 with positive systolic murmur, regular rhythm.   LUNGS: Clear to auscultation. Respirations are eupneic.   ABDOMEN: Soft, flat. Bowel sounds are normal throughout. Reports periumbilical tenderness. No hepatosplenomegaly or masses. No hepatosplenomegaly or masses.   RECTAL: Deferred.   SKIN: Warm and dry without rash.   EXTREMITIES: Lower extremities without edema, cyanosis, or clubbing. He has poor muscle mass in the legs, legs are thin.   MUSCULOSKELETAL: Gait not examined. Some bony arthritic type changes to the hands. No effusions, swelling or warmth. Range of motion is within normal bilateral.   NEUROLOGIC: Speech is clear, alert and oriented x3.   PSYCH: Affect and mood within normal. Patient is asking for food. He is hungry. Appears relaxed and comfortable.   LABORATORY, DIAGNOSTIC, AND RADIOLOGICAL DATA: Admission blood work 04/25/2011 with glucose 109, BUN 29, creatinine 0.90. Remaining electrolytes normal. Lipase 54, albumin 4.2, AST 21, ALT 19, alkaline phosphatase 35, total bilirubin 0.3. WBC 12.5, hemoglobin  7.9, hematocrit 24.1, platelet count 582, MCV 83, MCH 27, prothrombin time 22.8, INR 2.0, PTT 36.5. Valproic acid 41 (normal 50 to 100).   Glucose 103, BUN 36, electrolytes otherwise normal. WBC 10.3, hemoglobin 8.0. Repeat after  1 unit PRBC hemoglobin is 9.4. Pro time 16.5, INR 1.3, PTT 32.6.    CT scan of the abdomen and pelvis with contrast performed 04/25/2011 showing lung bases unremarkable. Liver, spleen, adrenals, pancreas, kidneys unremarkable. No evidence of abdominal aortic aneurysm. No evidence of bowel  obstruction. The descending colon demonstrates mild wall thickening though a component of this is secondary to decompression. No evidence of appreciable pericolonic free fluid. There is minimal stranding in the pericolonic mesenteric fat. Diffuse diverticulosis is appreciated within the sigmoid colon. Early or mild colitis within the descending colon and sigmoid colon cannot be excluded. No evidence of free air or pelvic free fluid. EKG performed 04/25/2011 with accelerated junctional rhythm, nonspecific ST abnormality. Abnormal EKG when compared to 04/10/2011.   IMPRESSION: Patient is with abdominal pain, chronic and he reports progressive with his personal concern of an ulcer. Eating and drinking seems to improve. He reports poor appetite, continued progressive weight loss. He reports rectal bleeding. CT scan performed showed possible colitis, possible diverticulitis, possible ischemic colitis. Patient has had antibiotic exposure with pneumonia and is on Flagyl with stool studies pending for possible C. difficile.   He presents with symptomatic iron deficiency anemia, has had blood transfusionx1 hemoglobin improved appropriately. He was placed on new blood thinner, Xarelto, on 04/13/2011 which is now on hold.. Recent hospitalization 04/13/2011 with pneumonia, rhabdomyolysis, atrial fibrillation with rapid ventricular response.  Patient has a history of severe aortic stenosis and undergoing evaluation for valve replacement once the gastrointestinal bleed issue is resolved.   Patient has history of hepatitis C virus active, genotype 1a, history of IV drug use, alcohol abuse now reported discontinued.   Patient with history of chronic obstructive pulmonary disease, stable. He has history of anxiety, bipolar disorder on multiple psychiatric medication.   PLAN: Case was discussed with Dr. Mechele Collin in collaboration of care. Records requested from Dr. [name], his cardiologist, and reviewed the heart  catheterization performed 01/22/2011 with aortic valve gradient 31 mmHg, peak to peak gradient 36 mmHg, aortic valve area 0.9 cm2.   Plans are for tentative EGD this afternoon. Patient is n.p.o. He is on a pantoprazole drip, and on IV Zosyn and Flagyl. Stool studies are pending.   Patient with cardiac history active, had bradycardia today and this was reviewed with the anesthesiologist and Dr. Mechele Collin. Further GI recommendations pending EGD results.   These services provided by Cala Bradford A. Arvilla Market, MS, APRN, BC, ANP under collaborative agreement with Lynnae Prude, M.D.   ____________________________ Ranae Plumber. Arvilla Market, ANP kam:cms D: 04/26/2011 13:27:10 ET T: 04/26/2011 14:07:38 ET  JOB#: [id number] cc: Cala Bradford A. Arvilla Market, ANP, <Dictator> Ranae Plumber. Suzette Battiest, MSN, ANP-BC Adult Nurse Practitioner ELECTRONICALLY SIGNED 04/26/2011 16:07

## 2014-06-30 NOTE — H&P (Signed)
PATIENT NAME:  Wayne Guzman, Wayne Guzman MR#:  [id number] DATE OF BIRTH:  Oct 20, 1950  DATE OF ADMISSION:  04/08/2011  PRIMARY CARE PHYSICIAN: [facility]  CHIEF COMPLAINT: Not feeling well, and shaking, and having fever and chills for the last several days.   HISTORY OF PRESENT ILLNESS:  Wayne Guzman is a 64 year old Caucasian gentleman with multiple medical problems, who comes to the Emergency Room brought in by EMS after neighbors found the patient. The patient has been bedbound for the past few days with fever and chills and with poor p.o. intake. The patient was found to have left lower lobe pneumonia. He was quite tachycardic with fever of 100.1. He was also found to have acute rhabdomyolysis. He was started on IV fluids, received a dose of IV Levaquin after blood cultures were drawn, and is being admitted for further evaluation and management.   PAST MEDICAL HISTORY:  1. Respiratory failure. He was admitted at [facility] in December of 2011; thereafter, he was transferred to [facility] and underwent repeated bronchoscopy for removal of foreign object from his right mainstem bronchus. The patient had a long hospital stay, I am assuming required feeding tube placement which was later on removed. 2. Hypertension.  3. Chronic obstructive pulmonary disease with ongoing tobacco abuse.  4. Anxiety disorder.  5. Chronic back pain.  6. Bipolar disorder.  7. 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   ALLERGIES: No known drug allergies.   MEDICATIONS:  1. Advair 250/50, 1 puff b.i.d.  2. Aspirin 81 mg daily.  3. Buspirone 10 mg daily.  4. Clonazepam 2 mg b.i.d.  5. Daliresp 500 mcg p.o. daily.  6. Depakote Delayed Release 3 tablets daily, which comes to 1500 mg.   7. Nexium 40 mg daily.  8. Fluticasone two sprays in each nostril daily.  9. Gabapentin 600 mg q.i.d.  10. Vicodin Extra Strength  7.5/750 mg p.o. b.i.d.   PAST SURGICAL HISTORY:  1. Two sinus surgeries.  2. Two knee surgeries.  3. Carpal tunnel surgery.   FAMILY HISTORY: Father died at 43 of heart trouble. Mother died at 49 of chronic medical issues.   REVIEW OF SYSTEMS: CONSTITUTIONAL: Positive for fever, fatigue and weakness. EYES: No blurred or double vision. ENT: No tinnitus, ear pain, hearing loss. RESPIRATORY: Positive for cough, shortness of breath. CARDIOVASCULAR: No chest pain, orthopnea, or edema. GASTROINTESTINAL: No nausea, vomiting, diarrhea, or abdominal pain. GU: No dysuria or hematuria. ENDOCRINE: No polyuria or nocturia. HEMATOLOGY: No anemia or easy bruising. SKIN: No acne or rash. MUSCULOSKELETAL: Positive for arthritis. NEUROLOGIC: No cerebrovascular accident or transient ischemic attack. PSYCHIATRIC: No anxiety or depression. Positive for bipolar disorder. All other systems reviewed and negative.   PHYSICAL EXAMINATION LABORATORY, DIAGNOSTIC AND RADIOLOGICAL DATA:   1. Chest x-ray consistent with left lower lobe pneumonia.  2. Influenza A and B negative.  3. Urinalysis negative for urinary tract infection.  4. Urine drug screen positive for benzodiazepines.  5. CT of the head is no acute intracranial process.  6. White count is 11.1, hemoglobin and hematocrit is 10.1 and 31.7, platelet count is 169. Comprehensive metabolic panel within normal limits except SGPT of 81, SGOT of 465, albumin of 3.3.  7. CK total is 8386, MB is 19.5. Troponin is 0.13.  8. PT-INR is 13.5.  9. Serum ethanol is 0.003.   ASSESSMENT: Wayne Guzman is a 64 year old with:  1. Systemic inflammatory response syndrome due to  left lower lobe pneumonia.  2. Acute rhabdomyolysis suspected because of the patient being bedbound for the last few days, poor p.o. intake and shaking with chills.  3. Chronic obstructive pulmonary disease without any exacerbation at present. 4. Ongoing tobacco abuse. Advised cessation. Spent about four  minutes.  5. Bipolar disorder.  6. Elevated transaminases, ALT more than EST, etiology unclear. The patient states he quit alcohol about 12 years ago. This could be part of rhabdomyolysis, however, will get an ultrasound of the abdomen.   PLAN:  1. Admit the patient to a medical floor.  2. IV fluids  3. Cycle cardiac enzymes x3 along with follow the trend of CPK.  4. We will continue Levaquin 750 IV daily.  5. Continue the rest of all home medications, including inhalers.  6. The patient does not appear to be in respiratory distress at present. I will hold off on steroids. Ultrasound of the abdomen for elevated transaminases.  7. Follow blood cultures, sputum cultures, and CK trend.  8. Further workup according to the patient's clinical course.   The hospital admission plan was discussed with the patient, who is agreeable to it. The patient has no family members. He lives alone. He appears to be a little bit anxious, however, he is stable.  CODE STATUS:  FULL CODE.    CRITICAL TIME SPENT:   60 minutes.    ____________________________ Wylie HailSona A. Allena KatzPatel, MD sap:cbb D: 04/08/2011 15:51:17 ET T: 04/08/2011 16:34:11 ET JOB#: [id number]  cc: Terita Hejl A. Allena KatzPatel, MD, <Dictator> [facility] Willow OraSONA A Adryen Cookson MD ELECTRONICALLY SIGNED 04/10/2011 15:41

## 2014-06-30 NOTE — Consult Note (Signed)
Pt hgb 9.4, WBC 23.4, intubated for resp failure, oral gastric tube passed for nutritition. No plans for further GI work up at this time.  Given the neg upper and findings on colon I would be very hesitant to do any further anticoagulation unless absolutely mandatory.  Dr. Bluford Kaufmannh will be on this weekend.  He will not see unless you call him about a GI problem.  Electronic Signatures: Scot JunElliott, Marilu Rylander T (MD)  (Signed on 22-Feb-13 13:29)  Authored  Last Updated: 22-Feb-13 13:29 by Scot JunElliott, Jaiyah Beining T (MD)
